# Patient Record
Sex: Female | Born: 2000 | Race: Black or African American | Hispanic: No | Marital: Single | Smoking: Never smoker
Health system: Southern US, Community
[De-identification: ages and names within clinical notes are randomized; demographics above are authoritative.]

---

## 2020-01-13 ENCOUNTER — Ambulatory Visit: Payer: Self-pay

## 2020-01-14 ENCOUNTER — Encounter (HOSPITAL_COMMUNITY): Payer: Self-pay

## 2020-01-14 ENCOUNTER — Ambulatory Visit (HOSPITAL_COMMUNITY)
Admission: EM | Admit: 2020-01-14 | Discharge: 2020-01-14 | Disposition: A | Discharge status: Home / Self Care | Payer: Medicaid Other | Attending: Emergency Medicine | Admitting: Emergency Medicine

## 2020-01-14 DIAGNOSIS — Z3202 Encounter for pregnancy test, result negative: Secondary | ICD-10-CM

## 2020-01-14 DIAGNOSIS — B3731 Acute candidiasis of vulva and vagina: Secondary | ICD-10-CM

## 2020-01-14 DIAGNOSIS — N760 Acute vaginitis: Secondary | ICD-10-CM

## 2020-01-14 DIAGNOSIS — N390 Urinary tract infection, site not specified: Secondary | ICD-10-CM

## 2020-01-14 DIAGNOSIS — B373 Candidiasis of vulva and vagina: Secondary | ICD-10-CM | POA: Diagnosis present

## 2020-01-14 LAB — POCT URINALYSIS DIPSTICK, ED / UC
Bilirubin Urine: NEGATIVE
Glucose, UA: NEGATIVE mg/dL
Ketones, ur: NEGATIVE mg/dL
Nitrite: NEGATIVE
Protein, ur: NEGATIVE mg/dL
Specific Gravity, Urine: 1.02 (ref 1.005–1.030)
Urobilinogen, UA: 0.2 mg/dL (ref 0.0–1.0)
pH: 6 (ref 5.0–8.0)

## 2020-01-14 LAB — POC URINE PREG, ED: Preg Test, Ur: NEGATIVE

## 2020-01-14 MED ORDER — MICONAZOLE NITRATE 2 % VA CREA: TOPICAL_CREAM | VAGINAL | 0 refills | Status: DC

## 2020-01-14 NOTE — Discharge Instructions (Signed)
This remains consistent with a yeast infection.  I have testing pending to confirm this and to ensure there is no other infectious process.  We will notify of you any positive findings or if any changes to treatment are needed. If normal or otherwise without concern to your results, we will not call you. Please log on to your MyChart to review your results if interested in so.   You may use the topical cream provided as well to help with external itching.  I would use the pills provided every other day. You should expect improvement.

## 2020-01-14 NOTE — ED Provider Notes (Signed)
[facility]    CSN: [id number] Arrival date & time: 01/14/20  1716      History   Chief Complaint Chief Complaint  Patient presents with  . Vaginitis  . Urinary Tract Infection    HPI Carrie Waters is a 19 y.o. female.   Carrie Waters presents with complaints of Vaginal itching, dryness and vulvar burning. Two days ago symptoms started. Went and saw another provider, was provided fluconazole for concern for yeast infection. Took one pill yesterday. Went this morning because she felt symptoms had worsened and had another exam, was provided additional fluconazole as told she had "severe yeast infection." Was also told she has a UTI. No urinary symptoms. She took additional fluconazole today, she feels that symptoms worsened.  Pain with walking. No vaginal odor. Describes discharge as cottage cheese appearing, but noted green color to this as well. Sexually active with 1 partner, doesn't use condoms. No previous stds. Has had yeast infections in the past but this feels worse. No history of diabetes.    ROS per HPI, negative if not otherwise mentioned.      History reviewed. No pertinent past medical history.  There are no problems to display for this patient.   History reviewed. No pertinent surgical history.  OB History   No obstetric history on file.      Home Medications    Prior to Admission medications   Medication Sig Start Date End Date Taking? Authorizing Provider  miconazole (MONISTAT 7) 2 % vaginal cream Topically to vulva twice a day as needed 01/14/20   Georgetta Haber, NP    Family History Family History  Family history unknown: Yes    Social History Social History   Tobacco Use  . Smoking status: Never Smoker  Substance Use Topics  . Alcohol use: Not on file  . Drug use: Not on file     Allergies   Sulfa antibiotics   Review of Systems Review of Systems   Physical Exam Triage Vital Signs ED Triage Vitals  Enc Vitals  Group     BP 01/14/20 2006 128/77     Pulse Rate 01/14/20 2006 75     Resp 01/14/20 2006 18     Temp 01/14/20 2006 98.7 F (37.1 C)     Temp Source 01/14/20 2006 Oral     SpO2 01/14/20 2006 97 %     Weight --      Height --      Head Circumference --      Peak Flow --      Pain Score 01/14/20 2008 9     Pain Loc --      Pain Edu? --      Excl. in GC? --    No data found.  Updated Vital Signs BP 128/77 (BP Location: Right Arm)   Pulse 75   Temp 98.7 F (37.1 C) (Oral)   Resp 18   SpO2 97%   Visual Acuity Right Eye Distance:   Left Eye Distance:   Bilateral Distance:    Right Eye Near:   Left Eye Near:    Bilateral Near:     Physical Exam Constitutional:      General: She is not in acute distress.    Appearance: She is well-developed.  Cardiovascular:     Rate and Rhythm: Normal rate.  Pulmonary:     Effort: Pulmonary effort is normal.  Genitourinary:    Comments: Significant thick white, slight green tinged,  cottage cheese discharge. No external rash or skin breakdown noted  Skin:    General: Skin is warm and dry.  Neurological:     Mental Status: She is alert and oriented to person, place, and time.      UC Treatments / Results  Labs (all labs ordered are listed, but only abnormal results are displayed) Labs Reviewed  POCT URINALYSIS DIPSTICK, ED / UC - Abnormal; Notable for the following components:      Result Value   Hgb urine dipstick TRACE (*)    Leukocytes,Ua LARGE (*)    All other components within normal limits  URINE CULTURE  POC URINE PREG, ED  CERVICOVAGINAL ANCILLARY ONLY    EKG   Radiology No results found.  Procedures Procedures (including critical care time)  Medications Ordered in UC Medications - No data to display  Initial Impression / Assessment and Plan / UC Course  I have reviewed the triage vital signs and the nursing notes.  Pertinent labs & imaging results that were available during my care of the patient were  reviewed by me and considered in my medical decision making (see chart for details).     Continue with prescribed fluconazole. External cream provided as well as needed. Supportive cares for itching, with vaginal cytology pending to ensure no other superimposed infection. Return precautions provided. Patient verbalized understanding and agreeable to plan.   Final Clinical Impressions(s) / UC Diagnoses   Final diagnoses:  Candidal vaginitis     Discharge Instructions     This remains consistent with a yeast infection.  I have testing pending to confirm this and to ensure there is no other infectious process.  We will notify of you any positive findings or if any changes to treatment are needed. If normal or otherwise without concern to your results, we will not call you. Please log on to your MyChart to review your results if interested in so.   You may use the topical cream provided as well to help with external itching.  I would use the pills provided every other day. You should expect improvement.     ED Prescriptions    Medication Sig Dispense Auth. Provider   miconazole (MONISTAT 7) 2 % vaginal cream Topically to vulva twice a day as needed 45 g Linus Mako B, NP     PDMP not reviewed this encounter.   Georgetta Haber, NP 01/14/20 2048

## 2020-01-14 NOTE — ED Triage Notes (Signed)
Pt presents with vaginal burning , itching, and dryness for past few days: pt states she was seen at another urgent care and diagnosed with UTI and yeast infection but they told her she had to choose between treatment because they would not give her a treatment for both.

## 2020-01-15 LAB — CERVICOVAGINAL ANCILLARY ONLY
Bacterial Vaginitis (gardnerella): NEGATIVE
Candida Glabrata: NEGATIVE
Candida Vaginitis: POSITIVE — AB
Chlamydia: NEGATIVE
Comment: NEGATIVE
Comment: NEGATIVE
Comment: NEGATIVE
Comment: NEGATIVE
Comment: NEGATIVE
Comment: NORMAL
Neisseria Gonorrhea: NEGATIVE
Trichomonas: NEGATIVE

## 2020-01-15 LAB — URINE CULTURE

## 2020-07-05 ENCOUNTER — Other Ambulatory Visit: Payer: Self-pay

## 2020-07-05 ENCOUNTER — Encounter (HOSPITAL_COMMUNITY): Payer: Self-pay

## 2020-07-05 ENCOUNTER — Ambulatory Visit (HOSPITAL_COMMUNITY)
Admission: RE | Admit: 2020-07-05 | Discharge: 2020-07-05 | Disposition: A | Discharge status: Home / Self Care | Payer: Medicaid Other | Source: Ambulatory Visit | Attending: Emergency Medicine | Admitting: Emergency Medicine

## 2020-07-05 VITALS — BP 110/94 | HR 75 | Temp 98.6°F | Resp 18

## 2020-07-05 DIAGNOSIS — R300 Dysuria: Secondary | ICD-10-CM | POA: Insufficient documentation

## 2020-07-05 LAB — POCT URINALYSIS DIPSTICK, ED / UC
Bilirubin Urine: NEGATIVE
Glucose, UA: NEGATIVE mg/dL
Ketones, ur: NEGATIVE mg/dL
Nitrite: NEGATIVE
Protein, ur: NEGATIVE mg/dL
Specific Gravity, Urine: 1.025 (ref 1.005–1.030)
Urobilinogen, UA: 0.2 mg/dL (ref 0.0–1.0)
pH: 8 (ref 5.0–8.0)

## 2020-07-05 NOTE — ED Triage Notes (Addendum)
Pt in with c/o dysuria, pt also requesting STD testing  States "it feels weird when I'm peeing and it feels like I have to pee a lot but I don't"  Denies any abdominal or back pain,

## 2020-07-05 NOTE — ED Provider Notes (Signed)
[facility]  ____________________________________________  Time seen: Approximately 3:14 PM  I have reviewed the triage vital signs and the nursing notes.   HISTORY  Chief Complaint dysuria and STD testing   Historian Patient    HPI Carrie Waters is a 20 y.o. female presents to the urgent care with mild dysuria.  Patient states that when she stops urinating she has some discomfort.  She states it started last night.  She denies increased urinary frequency or low back pain. No nausea or vomiting.  She states that she has not had recent unprotected sex.  Denies changes in vaginal discharge or dyspareunia.   History reviewed. No pertinent past medical history.   Immunizations up to date:  Yes.     History reviewed. No pertinent past medical history.  There are no problems to display for this patient.   History reviewed. No pertinent surgical history.  Prior to Admission medications   Medication Sig Start Date End Date Taking? Authorizing Provider  miconazole (MONISTAT 7) 2 % vaginal cream Topically to vulva twice a day as needed 01/14/20   Georgetta Haber, NP    Allergies Sulfa antibiotics  Family History  Family history unknown: Yes    Social History Social History   Tobacco Use  . Smoking status: Never Smoker  . Smokeless tobacco: Never Used     Review of Systems  Constitutional: No fever/chills Eyes:  No discharge ENT: No upper respiratory complaints. Respiratory: no cough. No SOB/ use of accessory muscles to breath Gastrointestinal:   No nausea, no vomiting.  No diarrhea.  No constipation. Musculoskeletal: Negative for musculoskeletal pain. Skin: Negative for rash, abrasions, lacerations, ecchymosis.    ____________________________________________   PHYSICAL EXAM:  VITAL SIGNS: ED Triage Vitals  Enc Vitals Group     BP 07/05/20 1418 (!) 110/94     Pulse Rate 07/05/20 1418 75     Resp 07/05/20 1418 18     Temp 07/05/20 1418  98.6 F (37 C)     Temp src --      SpO2 07/05/20 1418 99 %     Weight --      Height --      Head Circumference --      Peak Flow --      Pain Score 07/05/20 1417 0     Pain Loc --      Pain Edu? --      Excl. in GC? --      Constitutional: Alert and oriented. Well appearing and in no acute distress. Eyes: Conjunctivae are normal. PERRL. EOMI. Head: Atraumatic. ENT: Cardiovascular: Normal rate, regular rhythm. Normal S1 and S2.  Good peripheral circulation. Respiratory: Normal respiratory effort without tachypnea or retractions. Lungs CTAB. Good air entry to the bases with no decreased or absent breath sounds Gastrointestinal: Bowel sounds x 4 quadrants. Soft and nontender to palpation. No guarding or rigidity. No distention. Musculoskeletal: Full range of motion to all extremities. No obvious deformities noted Neurologic:  Normal for age. No gross focal neurologic deficits are appreciated.  Skin:  Skin is warm, dry and intact. No rash noted. Psychiatric: Mood and affect are normal for age. Speech and behavior are normal.   ____________________________________________   LABS (all labs ordered are listed, but only abnormal results are displayed)  Labs Reviewed  POCT URINALYSIS DIPSTICK, ED / UC - Abnormal; Notable for the following components:      Result Value   Hgb urine dipstick TRACE (*)  Leukocytes,Ua TRACE (*)    All other components within normal limits  URINE CULTURE  CERVICOVAGINAL ANCILLARY ONLY   ____________________________________________  EKG   ____________________________________________  RADIOLOGY  No results found.  ____________________________________________    PROCEDURES  Procedure(s) performed:     Procedures     Medications - No data to display   ____________________________________________   INITIAL IMPRESSION / ASSESSMENT AND PLAN / ED COURSE  Pertinent labs & imaging results that were available during my care of the  patient were reviewed by me and considered in my medical decision making (see chart for details).      Assessment and plan Dysuria 20 year old female presents to the urgent care with dysuria that started last night.  Vital signs are reassuring at triage.  Abdomen was soft and nontender without guarding.  Urinalysis does not show findings consistent with UTI.  Urine culture is pending.  Testing for gonorrhea, chlamydia, trichomoniasis, BV and yeast are also in process.  Given few symptoms at this time, will hold off on empiric treatment.  Patient was cautioned that should her urine culture indicate growth, patient will be prescribed an antibiotic. Recommended increased hydration at home.      ____________________________________________  FINAL CLINICAL IMPRESSION(S) / ED DIAGNOSES  Final diagnoses:  Dysuria      NEW MEDICATIONS STARTED DURING THIS VISIT:  ED Discharge Orders    None          This chart was dictated using voice recognition software/Dragon. Despite best efforts to proofread, errors can occur which can change the meaning. Any change was purely unintentional.     Orvil Feil, PA-C 07/05/20 1518

## 2020-07-05 NOTE — Discharge Instructions (Signed)
Continue to stay hydrated at home.

## 2020-07-06 ENCOUNTER — Telehealth (HOSPITAL_COMMUNITY): Payer: Self-pay | Admitting: Emergency Medicine

## 2020-07-06 LAB — CERVICOVAGINAL ANCILLARY ONLY
Bacterial Vaginitis (gardnerella): POSITIVE — AB
Candida Glabrata: NEGATIVE
Candida Vaginitis: NEGATIVE
Chlamydia: NEGATIVE
Comment: NEGATIVE
Comment: NEGATIVE
Comment: NEGATIVE
Comment: NEGATIVE
Comment: NEGATIVE
Comment: NORMAL
Neisseria Gonorrhea: NEGATIVE
Trichomonas: NEGATIVE

## 2020-07-06 MED ORDER — METRONIDAZOLE 500 MG PO TABS: 500.0000 mg | ORAL_TABLET | Freq: Two times a day (BID) | ORAL | 0 refills | Status: DC

## 2020-07-06 MED ORDER — FLUCONAZOLE 150 MG PO TABS: 150.0000 mg | ORAL_TABLET | Freq: Once | ORAL | 0 refills | Status: AC

## 2020-07-07 ENCOUNTER — Telehealth (HOSPITAL_COMMUNITY): Payer: Self-pay | Admitting: Emergency Medicine

## 2020-07-07 LAB — URINE CULTURE: Culture: >=100000 — AB

## 2020-07-07 MED ORDER — NITROFURANTOIN MONOHYD MACRO 100 MG PO CAPS: 100.0000 mg | ORAL_CAPSULE | Freq: Two times a day (BID) | ORAL | 0 refills | Status: DC

## 2020-08-20 DIAGNOSIS — Z862 Personal history of diseases of the blood and blood-forming organs and certain disorders involving the immune mechanism: Secondary | ICD-10-CM | POA: Insufficient documentation

## 2020-11-27 ENCOUNTER — Other Ambulatory Visit: Payer: Self-pay

## 2020-11-27 ENCOUNTER — Encounter (HOSPITAL_COMMUNITY): Payer: Self-pay | Admitting: Emergency Medicine

## 2020-11-27 ENCOUNTER — Ambulatory Visit (HOSPITAL_COMMUNITY)
Admission: EM | Admit: 2020-11-27 | Discharge: 2020-11-27 | Disposition: A | Discharge status: Home / Self Care | Payer: Medicaid Other | Attending: Student | Admitting: Student

## 2020-11-27 DIAGNOSIS — B373 Candidiasis of vulva and vagina: Secondary | ICD-10-CM | POA: Diagnosis not present

## 2020-11-27 DIAGNOSIS — B3731 Acute candidiasis of vulva and vagina: Secondary | ICD-10-CM

## 2020-11-27 DIAGNOSIS — M5440 Lumbago with sciatica, unspecified side: Secondary | ICD-10-CM

## 2020-11-27 DIAGNOSIS — Z113 Encounter for screening for infections with a predominantly sexual mode of transmission: Secondary | ICD-10-CM | POA: Diagnosis not present

## 2020-11-27 MED ORDER — TIZANIDINE HCL 2 MG PO TABS: 2.0000 mg | ORAL_TABLET | Freq: Three times a day (TID) | ORAL | 0 refills | Status: DC | PRN

## 2020-11-27 MED ORDER — FLUCONAZOLE 150 MG PO TABS: 150.0000 mg | ORAL_TABLET | Freq: Every day | ORAL | 0 refills | Status: DC

## 2020-11-27 NOTE — ED Provider Notes (Signed)
[facility]    CSN: [id number] Arrival date & time: 11/27/20  1609      History   Chief Complaint Chief Complaint  Patient presents with   Vaginal Discharge    HPI Carrie Waters is a 20 y.o. female presenting with vaginal discharge, STI screen, lumbar strain with sciatica.  Medical history BV, yeast. -Notes 3 days of thick white vaginal discharge with some external itching, consistent with previous yeast infections.  She does have 1 new female partner and endorses unprotected sex.  Denies new products, change in routine, recent abx. Denies hematuria, dysuria, frequency, urgency, flank pain, n/v/d/abd pain, fevers/chills, abdnormal vaginal rashes/lesions.  -OCP contraception, states not pregnant or breastfeeding. -Also with few months of left-sided lumbar paraspinous muscle tenderness with left-sided sciatica, following standing all day at work.  Has not tried any medications for the symptoms. Denies numbness in arms/legs, denies weakness in arms/legs, denies saddle anesthesia, denies bowel/bladder incontinence, denies urinary retention, denies constipation.   HPI  History reviewed. No pertinent past medical history.  There are no problems to display for this patient.   History reviewed. No pertinent surgical history.  OB History   No obstetric history on file.      Home Medications    Prior to Admission medications   Medication Sig Start Date End Date Taking? Authorizing Provider  fluconazole (DIFLUCAN) 150 MG tablet Take 1 tablet (150 mg total) by mouth daily. -For your yeast infection, start the Diflucan (fluconazole)- Take one pill today (day 1). If you're still having symptoms in 3 days, take the second pill. 11/27/20  Yes Rhys Martini, PA-C  tiZANidine (ZANAFLEX) 2 MG tablet Take 1 tablet (2 mg total) by mouth every 8 (eight) hours as needed for muscle spasms. 11/27/20  Yes Rhys Martini, PA-C    Family History Family History  Problem Relation Age of  Onset   Healthy Mother    Healthy Father     Social History Social History   Tobacco Use   Smoking status: Never   Smokeless tobacco: Never  Vaping Use   Vaping Use: Never used  Substance Use Topics   Alcohol use: Yes   Drug use: Never     Allergies   Sulfa antibiotics   Review of Systems Review of Systems  Constitutional:  Negative for chills, fever and unexpected weight change.  HENT:  Negative for sore throat.   Eyes:  Negative for pain and redness.  Respiratory:  Negative for chest tightness and shortness of breath.   Cardiovascular:  Negative for chest pain and palpitations.  Gastrointestinal:  Negative for abdominal pain, diarrhea, nausea and vomiting.  Genitourinary:  Positive for vaginal discharge. Negative for decreased urine volume, difficulty urinating, dysuria, flank pain, frequency, genital sores, hematuria, menstrual problem, pelvic pain, urgency, vaginal bleeding and vaginal pain.  Musculoskeletal:  Positive for back pain. Negative for arthralgias, gait problem, joint swelling, myalgias, neck pain and neck stiffness.  Skin:  Negative for rash and wound.  Neurological:  Negative for dizziness, tremors, seizures, syncope, facial asymmetry, speech difficulty, weakness, light-headedness, numbness and headaches.  All other systems reviewed and are negative.   Physical Exam Triage Vital Signs ED Triage Vitals  Enc Vitals Group     BP 11/27/20 1620 111/72     Pulse Rate 11/27/20 1620 71     Resp 11/27/20 1620 18     Temp 11/27/20 1620 99 F (37.2 C)     Temp Source 11/27/20 1620 Oral  SpO2 11/27/20 1620 100 %     Weight --      Height --      Head Circumference --      Peak Flow --      Pain Score 11/27/20 1617 0     Pain Loc --      Pain Edu? --      Excl. in GC? --    No data found.  Updated Vital Signs BP 111/72 (BP Location: Right Arm)   Pulse 71   Temp 99 F (37.2 C) (Oral)   Resp 18   LMP 11/13/2020   SpO2 100%   Visual  Acuity Right Eye Distance:   Left Eye Distance:   Bilateral Distance:    Right Eye Near:   Left Eye Near:    Bilateral Near:     Physical Exam Vitals reviewed.  Constitutional:      General: She is not in acute distress.    Appearance: Normal appearance. She is not ill-appearing.  HENT:     Head: Normocephalic and atraumatic.     Mouth/Throat:     Mouth: Mucous membranes are moist.     Comments: Moist mucous membranes Eyes:     Extraocular Movements: Extraocular movements intact.     Pupils: Pupils are equal, round, and reactive to light.  Cardiovascular:     Rate and Rhythm: Normal rate and regular rhythm.     Heart sounds: Normal heart sounds.  Pulmonary:     Effort: Pulmonary effort is normal.     Breath sounds: Normal breath sounds and air entry. No wheezing, rhonchi or rales.  Abdominal:     General: Bowel sounds are normal. There is no distension.     Palpations: Abdomen is soft. There is no mass.     Tenderness: There is no abdominal tenderness. There is no right CVA tenderness, left CVA tenderness, guarding or rebound.  Genitourinary:    Comments: deferred Musculoskeletal:     Cervical back: Normal range of motion. No swelling, deformity, signs of trauma, rigidity, spasms, tenderness, bony tenderness or crepitus. No pain with movement.     Thoracic back: No swelling, deformity, signs of trauma, spasms, tenderness or bony tenderness. Normal range of motion. No scoliosis.     Lumbar back: No swelling, deformity, signs of trauma, spasms, tenderness or bony tenderness. Normal range of motion. Negative right straight leg raise test and negative left straight leg raise test. No scoliosis.     Comments: Left-sided lumbar paraspinous muscle tenderness elicited with extension lumbar spine. No tenderness to palpation. Strength and sensation intact upper and lower extremities, no saddle anaesthesia. Negative straight leg raise bilaterally. No midline spinous tenderness, deformity,  stepoff.  Absolutely no other injury, deformity, tenderness, ecchymosis, abrasion.  Skin:    General: Skin is warm.     Capillary Refill: Capillary refill takes less than 2 seconds.     Comments: Good skin turgor  Neurological:     General: No focal deficit present.     Mental Status: She is alert and oriented to person, place, and time.     Cranial Nerves: No cranial nerve deficit.  Psychiatric:        Mood and Affect: Mood normal.        Behavior: Behavior normal.        Thought Content: Thought content normal.        Judgment: Judgment normal.     UC Treatments / Results  Labs (all labs ordered are  listed, but only abnormal results are displayed) Labs Reviewed  CERVICOVAGINAL ANCILLARY ONLY    EKG   Radiology No results found.  Procedures Procedures (including critical care time)  Medications Ordered in UC Medications - No data to display  Initial Impression / Assessment and Plan / UC Course  I have reviewed the triage vital signs and the nursing notes.  Pertinent labs & imaging results that were available during my care of the patient were reviewed by me and considered in my medical decision making (see chart for details).     This patient is a very pleasant 20 y.o. year old presenting with multiple complaints: vaginal discharge/suspected candidiasis; STI screen; lumbar strain with sciatica. OCP for contraception, states LMP was 2 weeks ago and she is not breastfeeding. Afebrile, nontachycardic, no reproducible abd pain or CVAT. History vaginal candidiasis and BV.   Will send self-swab for G/C, trich, yeast, BV testing. Declines HIV, RPR. Will treat empirically with diflucan as below. Safe sex precautions.   For lumbar strain with sciatica- no red flag symptoms. Trial of Zanaflex, heat, ROM exercises. F/u with ortho.  ED return precautions discussed. Patient verbalizes understanding and agreement.   Coding Level 4 for review of past notes/labs, order and  interpretation of labs today, and prescription drug management  Final Clinical Impressions(s) / UC Diagnoses   Final diagnoses:  Back pain of lumbar region with sciatica  Vaginal candida  Routine screening for STI (sexually transmitted infection)     Discharge Instructions      For the yeast infection: -For your yeast infection, start the Diflucan (fluconazole)- Take one pill today (day 1). If you're still having symptoms in 3 days, take the second pill.  -We have sent testing for sexually transmitted infections. We will notify you of any positive results once they are received. If required, we will prescribe any medications you might need. Please refrain from all sexual activity until treatment is complete.  -Seek additional medical attention if you develop fevers/chills, new/worsening abdominal pain, new/worsening vaginal discomfort/discharge, etc.  For the back pain:  -Start the muscle relaxer-Zanaflex (tizanidine), up to 3 times daily for muscle spasms and pain.  This can make you drowsy, so take at bedtime or when you do not need to drive or operate machinery. -You can also take Tylenol up to 1000 mg 3 times daily, and ibuprofen up to 800 mg 3 times daily with food.  You can take these together, or alternate every 3-4 hours. -Heating pad, gentle range of motion exercises (information later in paperwork) -If symptoms persist in 7 days, follow-up with an orthopedist. I recommend [facility] at [address] [place], [place] [address]. You can schedule an appointment by calling [phone]) or online [email or web address]), but they also have a walk-in clinic M-F 8a-8p and Sat 10a-3p.      ED Prescriptions     Medication Sig Dispense Auth. Provider   fluconazole (DIFLUCAN) 150 MG tablet Take 1 tablet (150 mg total) by mouth daily. -For your yeast infection, start the Diflucan (fluconazole)- Take one pill today (day 1). If you're still having symptoms in 3 days, take the second  pill. 2 tablet Rhys Martini, PA-C   tiZANidine (ZANAFLEX) 2 MG tablet Take 1 tablet (2 mg total) by mouth every 8 (eight) hours as needed for muscle spasms. 21 tablet Rhys Martini, PA-C      PDMP not reviewed this encounter.   Rhys Martini, PA-C 11/27/20 1650

## 2020-11-27 NOTE — Discharge Instructions (Addendum)
For the yeast infection: -For your yeast infection, start the Diflucan (fluconazole)- Take one pill today (day 1). If you're still having symptoms in 3 days, take the second pill.  -We have sent testing for sexually transmitted infections. We will notify you of any positive results once they are received. If required, we will prescribe any medications you might need. Please refrain from all sexual activity until treatment is complete.  -Seek additional medical attention if you develop fevers/chills, new/worsening abdominal pain, new/worsening vaginal discomfort/discharge, etc.  For the back pain:  -Start the muscle relaxer-Zanaflex (tizanidine), up to 3 times daily for muscle spasms and pain.  This can make you drowsy, so take at bedtime or when you do not need to drive or operate machinery. -You can also take Tylenol up to 1000 mg 3 times daily, and ibuprofen up to 800 mg 3 times daily with food.  You can take these together, or alternate every 3-4 hours. -Heating pad, gentle range of motion exercises (information later in paperwork) -If symptoms persist in 7 days, follow-up with an orthopedist. I recommend [facility] at [address] [place], [place] [address]. You can schedule an appointment by calling [phone]) or online [email or web address]), but they also have a walk-in clinic M-F 8a-8p and Sat 10a-3p.

## 2020-11-27 NOTE — ED Triage Notes (Signed)
Reports white vaginal discharge that started 2-3 days ago.  No odor, no itching.  No abdominal pain or back pain

## 2020-11-29 LAB — CERVICOVAGINAL ANCILLARY ONLY
Bacterial Vaginitis (gardnerella): POSITIVE — AB
Candida Glabrata: NEGATIVE
Candida Vaginitis: NEGATIVE
Chlamydia: NEGATIVE
Comment: NEGATIVE
Comment: NEGATIVE
Comment: NEGATIVE
Comment: NEGATIVE
Comment: NEGATIVE
Comment: NORMAL
Neisseria Gonorrhea: NEGATIVE
Trichomonas: NEGATIVE

## 2020-12-02 ENCOUNTER — Telehealth (HOSPITAL_COMMUNITY): Payer: Self-pay | Admitting: Emergency Medicine

## 2020-12-02 MED ORDER — METRONIDAZOLE 500 MG PO TABS: 500.0000 mg | ORAL_TABLET | Freq: Two times a day (BID) | ORAL | 0 refills | Status: DC

## 2021-01-19 ENCOUNTER — Encounter (HOSPITAL_COMMUNITY): Payer: Self-pay

## 2021-01-19 ENCOUNTER — Ambulatory Visit (HOSPITAL_COMMUNITY)
Admission: EM | Admit: 2021-01-19 | Discharge: 2021-01-19 | Disposition: A | Discharge status: Home / Self Care | Payer: Medicaid Other | Attending: Internal Medicine | Admitting: Internal Medicine

## 2021-01-19 DIAGNOSIS — N898 Other specified noninflammatory disorders of vagina: Secondary | ICD-10-CM | POA: Diagnosis present

## 2021-01-19 DIAGNOSIS — Z113 Encounter for screening for infections with a predominantly sexual mode of transmission: Secondary | ICD-10-CM

## 2021-01-19 DIAGNOSIS — Z114 Encounter for screening for human immunodeficiency virus [HIV]: Secondary | ICD-10-CM | POA: Diagnosis present

## 2021-01-19 LAB — HIV ANTIBODY (ROUTINE TESTING W REFLEX): HIV Screen 4th Generation wRfx: NONREACTIVE

## 2021-01-19 NOTE — Discharge Instructions (Addendum)
As we discussed, we will test you for bacterial vaginosis, yeast, trichomonas, gonorrhea, chlamydia.  These results will likely come back tomorrow and we will call you and treat you if there is something that needs to be treated.  We will also test you for HIV and syphilis through your blood.  You will be contacted with these results as well.  I recommend following up with your primary care provider for a Pap smear.  If you have significant worsening of symptoms including pain or developing fever, or inability to tolerate anything by mouth, you should be seen by medical provider right away.

## 2021-01-19 NOTE — ED Triage Notes (Signed)
Pt presents with vaginal odor X 4 days.

## 2021-01-19 NOTE — ED Provider Notes (Signed)
[facility]    CSN: [id number] Arrival date & time: 01/19/21  1014      History   Chief Complaint Chief Complaint  Patient presents with   Vaginal Odor    HPI Carrie Waters is a 20 y.o. female.   Vaginal Discharge/Odor Discharge started a few days ago Discharge appears to have some spotting in it, but has stopped now She endorses vaginal odors She denies vaginal pruritis, dysuria, hematuria, pelvic pain, nausea, vomiting, fevers No history of STIs She is sexually active and occasionally uses condoms.   Contraception: Nexplanon.   No LMP recorded. Patient has had an implant.  Last Pap: never Desires HIV/RPR: yes      History reviewed. No pertinent past medical history.  There are no problems to display for this patient.   History reviewed. No pertinent surgical history.  OB History   No obstetric history on file.      Home Medications    Prior to Admission medications   Medication Sig Start Date End Date Taking? Authorizing Provider  fluconazole (DIFLUCAN) 150 MG tablet Take 1 tablet (150 mg total) by mouth daily. -For your yeast infection, start the Diflucan (fluconazole)- Take one pill today (day 1). If you're still having symptoms in 3 days, take the second pill. 11/27/20   Rhys Martini, PA-C  metroNIDAZOLE (FLAGYL) 500 MG tablet Take 1 tablet (500 mg total) by mouth 2 (two) times daily. 12/02/20   Lamptey, Britta Mccreedy, MD  tiZANidine (ZANAFLEX) 2 MG tablet Take 1 tablet (2 mg total) by mouth every 8 (eight) hours as needed for muscle spasms. 11/27/20   Rhys Martini, PA-C    Family History Family History  Problem Relation Age of Onset   Healthy Mother    Healthy Father     Social History Social History   Tobacco Use   Smoking status: Never   Smokeless tobacco: Never  Vaping Use   Vaping Use: Never used  Substance Use Topics   Alcohol use: Yes   Drug use: Never     Allergies   Sulfa antibiotics   Review of Systems Review  of Systems  All other systems reviewed and are negative. Per HPI  Physical Exam Triage Vital Signs ED Triage Vitals  Enc Vitals Group     BP      Pulse      Resp      Temp      Temp src      SpO2      Weight      Height      Head Circumference      Peak Flow      Pain Score      Pain Loc      Pain Edu?      Excl. in GC?    No data found.  Updated Vital Signs BP 110/67 (BP Location: Left Arm)   Pulse 69   Temp 99.3 F (37.4 C) (Oral)   Resp 18   SpO2 96%   Visual Acuity Right Eye Distance:   Left Eye Distance:   Bilateral Distance:    Right Eye Near:   Left Eye Near:    Bilateral Near:     Physical Exam Constitutional:      General: She is not in acute distress.    Appearance: Normal appearance. She is not ill-appearing or toxic-appearing.  Pulmonary:     Effort: Pulmonary effort is normal. No respiratory distress.  Abdominal:  Palpations: Abdomen is soft.     Tenderness: There is no abdominal tenderness. There is no right CVA tenderness or left CVA tenderness.  Skin:    General: Skin is warm and dry.  Neurological:     Mental Status: She is alert and oriented to person, place, and time.     UC Treatments / Results  Labs (all labs ordered are listed, but only abnormal results are displayed) Labs Reviewed  HIV ANTIBODY (ROUTINE TESTING W REFLEX)  RPR  CERVICOVAGINAL ANCILLARY ONLY    EKG   Radiology No results found.  Procedures Procedures (including critical care time)  Medications Ordered in UC Medications - No data to display  Initial Impression / Assessment and Plan / UC Course  I have reviewed the triage vital signs and the nursing notes.  Pertinent labs & imaging results that were available during my care of the patient were reviewed by me and considered in my medical decision making (see chart for details).     Patient is a 20 year old female who presents with vaginal odor and concerns for bacterial vaginosis per her  report.  She would also like STD testing today, desires HIV and RPR as well.  She has a Nexplanon that is up-to-date per her report.  Has never had a Pap smear.  Did recommend follow-up with her primary care provider for a Pap smear.  Vaginal swab and HIV/RPR performed today.  Will wait to treat based off of these results.  She was discharged home in stable condition.   Final Clinical Impressions(s) / UC Diagnoses   Final diagnoses:  Vaginal odor  Screen for sexually transmitted diseases  Screening for HIV (human immunodeficiency virus)     Discharge Instructions      As we discussed, we will test you for bacterial vaginosis, yeast, trichomonas, gonorrhea, chlamydia.  These results will likely come back tomorrow and we will call you and treat you if there is something that needs to be treated.  We will also test you for HIV and syphilis through your blood.  You will be contacted with these results as well.  I recommend following up with your primary care provider for a Pap smear.  If you have significant worsening of symptoms including pain or developing fever, or inability to tolerate anything by mouth, you should be seen by medical provider right away.     ED Prescriptions   None    PDMP not reviewed this encounter.   Butler Vegh, Solmon Ice, DO 01/19/21 1142

## 2021-01-20 ENCOUNTER — Telehealth (HOSPITAL_COMMUNITY): Payer: Self-pay | Admitting: Emergency Medicine

## 2021-01-20 LAB — CERVICOVAGINAL ANCILLARY ONLY
Bacterial Vaginitis (gardnerella): POSITIVE — AB
Candida Glabrata: NEGATIVE
Candida Vaginitis: NEGATIVE
Chlamydia: NEGATIVE
Comment: NEGATIVE
Comment: NEGATIVE
Comment: NEGATIVE
Comment: NEGATIVE
Comment: NEGATIVE
Comment: NORMAL
Neisseria Gonorrhea: NEGATIVE
Trichomonas: NEGATIVE

## 2021-01-20 LAB — RPR: RPR Ser Ql: NONREACTIVE

## 2021-01-20 MED ORDER — METRONIDAZOLE 500 MG PO TABS: 500.0000 mg | ORAL_TABLET | Freq: Two times a day (BID) | ORAL | 0 refills | Status: DC

## 2021-01-21 DIAGNOSIS — F4323 Adjustment disorder with mixed anxiety and depressed mood: Secondary | ICD-10-CM | POA: Insufficient documentation

## 2021-01-24 ENCOUNTER — Ambulatory Visit (HOSPITAL_COMMUNITY)
Admission: EM | Admit: 2021-01-24 | Discharge: 2021-01-24 | Disposition: A | Discharge status: Home / Self Care | Payer: Medicaid Other | Attending: Physician Assistant | Admitting: Physician Assistant

## 2021-01-24 ENCOUNTER — Other Ambulatory Visit: Payer: Self-pay

## 2021-01-24 ENCOUNTER — Encounter (HOSPITAL_COMMUNITY): Payer: Self-pay | Admitting: Emergency Medicine

## 2021-01-24 DIAGNOSIS — R0982 Postnasal drip: Secondary | ICD-10-CM | POA: Insufficient documentation

## 2021-01-24 DIAGNOSIS — Z20822 Contact with and (suspected) exposure to covid-19: Secondary | ICD-10-CM | POA: Diagnosis not present

## 2021-01-24 DIAGNOSIS — J029 Acute pharyngitis, unspecified: Secondary | ICD-10-CM | POA: Diagnosis not present

## 2021-01-24 DIAGNOSIS — R519 Headache, unspecified: Secondary | ICD-10-CM | POA: Diagnosis not present

## 2021-01-24 DIAGNOSIS — B349 Viral infection, unspecified: Secondary | ICD-10-CM

## 2021-01-24 DIAGNOSIS — R509 Fever, unspecified: Secondary | ICD-10-CM | POA: Diagnosis present

## 2021-01-24 DIAGNOSIS — Z882 Allergy status to sulfonamides status: Secondary | ICD-10-CM | POA: Diagnosis not present

## 2021-01-24 LAB — POCT RAPID STREP A, ED / UC: Streptococcus, Group A Screen (Direct): NEGATIVE

## 2021-01-24 NOTE — ED Triage Notes (Signed)
Pt c/o right neck lymph nodes that have been swollen for several days. Pt also having fevers, headaches, n/v and pain in right side of neck now spreading to posterior neck.

## 2021-01-24 NOTE — ED Provider Notes (Addendum)
[facility]    CSN: [id number] Arrival date & time: 01/24/21  1425      History   Chief Complaint Chief Complaint  Patient presents with   Fever   Headache    HPI Carrie Waters is a 20 y.o. female.   Pt complains of fatigue, fever, and headache that started last night.  Reports some sore throat and postnasal drip. Reports enlarged lymph nodes to the left neck that she noticed two days ago.  Reports fever at home of 100.  She denies congestion, cough, body aches, shortness of breath.  She has taken nothing for the sx.  She has had her COVID vaccine and booster.  Pt is in nursing school, recent completed a clinical in pediatrics.    History reviewed. No pertinent past medical history.  There are no problems to display for this patient.   History reviewed. No pertinent surgical history.  OB History   No obstetric history on file.      Home Medications    Prior to Admission medications   Medication Sig Start Date End Date Taking? Authorizing Provider  cefdinir (OMNICEF) 300 MG capsule Take 1 capsule (300 mg total) by mouth 2 (two) times daily. 01/26/21   Raspet, Noberto Retort, PA-C  tiZANidine (ZANAFLEX) 2 MG tablet Take 1 tablet (2 mg total) by mouth every 8 (eight) hours as needed for muscle spasms. 11/27/20   Rhys Martini, PA-C    Family History Family History  Problem Relation Age of Onset   Healthy Mother    Healthy Father     Social History Social History   Tobacco Use   Smoking status: Never   Smokeless tobacco: Never  Vaping Use   Vaping Use: Never used  Substance Use Topics   Alcohol use: Yes   Drug use: Never     Allergies   Sulfa antibiotics   Review of Systems Review of Systems  Constitutional:  Positive for fatigue and fever. Negative for chills.  HENT:  Positive for sore throat. Negative for congestion, ear pain and rhinorrhea.   Eyes:  Negative for pain and visual disturbance.  Respiratory:  Negative for cough and shortness  of breath.   Cardiovascular:  Negative for chest pain and palpitations.  Gastrointestinal:  Negative for abdominal pain and vomiting.  Genitourinary:  Negative for dysuria and hematuria.  Musculoskeletal:  Negative for arthralgias and back pain.  Skin:  Negative for color change and rash.  Neurological:  Negative for seizures and syncope.  All other systems reviewed and are negative.   Physical Exam Triage Vital Signs ED Triage Vitals  Enc Vitals Group     BP 01/24/21 1618 115/72     Pulse Rate 01/24/21 1618 (!) 113     Resp 01/24/21 1618 17     Temp 01/24/21 1618 99.5 F (37.5 C)     Temp Source 01/24/21 1618 Oral     SpO2 01/24/21 1618 98 %     Weight --      Height --      Head Circumference --      Peak Flow --      Pain Score 01/24/21 1617 6     Pain Loc --      Pain Edu? --      Excl. in GC? --    No data found.  Updated Vital Signs BP 115/72 (BP Location: Left Arm)   Pulse (!) 113   Temp 99.5 F (37.5 C) (Oral)  Resp 17   SpO2 98%   Visual Acuity Right Eye Distance:   Left Eye Distance:   Bilateral Distance:    Right Eye Near:   Left Eye Near:    Bilateral Near:     Physical Exam Vitals and nursing note reviewed.  Constitutional:      General: She is not in acute distress.    Appearance: She is well-developed.  HENT:     Head: Normocephalic and atraumatic.     Mouth/Throat:     Pharynx: Pharyngeal swelling and posterior oropharyngeal erythema present.     Tonsils: No tonsillar exudate.  Eyes:     Conjunctiva/sclera: Conjunctivae normal.  Cardiovascular:     Rate and Rhythm: Normal rate and regular rhythm.     Heart sounds: No murmur heard. Pulmonary:     Effort: Pulmonary effort is normal. No respiratory distress.     Breath sounds: Normal breath sounds.  Abdominal:     Palpations: Abdomen is soft.     Tenderness: There is no abdominal tenderness.  Musculoskeletal:     Cervical back: Neck supple.  Skin:    General: Skin is warm and dry.   Neurological:     Mental Status: She is alert.     UC Treatments / Results  Labs (all labs ordered are listed, but only abnormal results are displayed) Labs Reviewed  SARS CORONAVIRUS 2 (TAT 6-24 HRS)  CULTURE, GROUP A STREP [facility])  POCT RAPID STREP A, ED / UC    EKG   Radiology No results found.  Procedures Procedures (including critical care time)  Medications Ordered in UC Medications - No data to display  Initial Impression / Assessment and Plan / UC Course  I have reviewed the triage vital signs and the nursing notes.  Pertinent labs & imaging results that were available during my care of the patient were reviewed by me and considered in my medical decision making (see chart for details).     Viral illness, COVID test pending.  Pt vitals wnl, overall well appearing, in no acute distress.  Advised supportive treatment.  COVID test pending.  Work note given for today. Return precautions discussed.  Final Clinical Impressions(s) / UC Diagnoses   Final diagnoses:  Viral illness     Discharge Instructions      Recommend Tylenol as needed for headache and fever Drink plenty of fluids and rest Can use Mucinex and Flonase for post nasal drip  COVID test pending      ED Prescriptions   None    PDMP not reviewed this encounter.   Ward, Tylene Fantasia, PA-C 02/02/21 1633    Ward, Tylene Fantasia, PA-C 02/02/21 1646

## 2021-01-24 NOTE — Discharge Instructions (Addendum)
Recommend Tylenol as needed for headache and fever Drink plenty of fluids and rest Can use Mucinex and Flonase for post nasal drip  COVID test pending

## 2021-01-25 LAB — SARS CORONAVIRUS 2 (TAT 6-24 HRS): SARS Coronavirus 2: NEGATIVE

## 2021-01-26 ENCOUNTER — Other Ambulatory Visit: Payer: Self-pay

## 2021-01-26 ENCOUNTER — Encounter (HOSPITAL_COMMUNITY): Payer: Self-pay

## 2021-01-26 ENCOUNTER — Ambulatory Visit (HOSPITAL_COMMUNITY)
Admission: EM | Admit: 2021-01-26 | Discharge: 2021-01-26 | Disposition: A | Discharge status: Home / Self Care | Payer: Medicaid Other | Attending: Physician Assistant | Admitting: Physician Assistant

## 2021-01-26 DIAGNOSIS — M542 Cervicalgia: Secondary | ICD-10-CM | POA: Diagnosis not present

## 2021-01-26 DIAGNOSIS — J029 Acute pharyngitis, unspecified: Secondary | ICD-10-CM | POA: Insufficient documentation

## 2021-01-26 DIAGNOSIS — R591 Generalized enlarged lymph nodes: Secondary | ICD-10-CM | POA: Diagnosis not present

## 2021-01-26 LAB — CBC WITH DIFFERENTIAL/PLATELET
Abs Immature Granulocytes: 0.02 10*3/uL (ref 0.00–0.07)
Basophils Absolute: 0 10*3/uL (ref 0.0–0.1)
Basophils Relative: 0 %
Eosinophils Absolute: 0.1 10*3/uL (ref 0.0–0.5)
Eosinophils Relative: 1 %
HCT: 42.4 % (ref 36.0–46.0)
Hemoglobin: 14.2 g/dL (ref 12.0–15.0)
Immature Granulocytes: 0 %
Lymphocytes Relative: 17 %
Lymphs Abs: 1.6 10*3/uL (ref 0.7–4.0)
MCH: 28 pg (ref 26.0–34.0)
MCHC: 33.5 g/dL (ref 30.0–36.0)
MCV: 83.6 fL (ref 80.0–100.0)
Monocytes Absolute: 1.3 10*3/uL — ABNORMAL HIGH (ref 0.1–1.0)
Monocytes Relative: 13 %
Neutro Abs: 6.5 10*3/uL (ref 1.7–7.7)
Neutrophils Relative %: 69 %
Platelets: 186 10*3/uL (ref 150–400)
RBC: 5.07 MIL/uL (ref 3.87–5.11)
RDW: 13.1 % (ref 11.5–15.5)
WBC: 9.5 10*3/uL (ref 4.0–10.5)
nRBC: 0 % (ref 0.0–0.2)

## 2021-01-26 LAB — COMPREHENSIVE METABOLIC PANEL
ALT: 31 U/L (ref 0–44)
AST: 30 U/L (ref 15–41)
Albumin: 3.9 g/dL (ref 3.5–5.0)
Alkaline Phosphatase: 55 U/L (ref 38–126)
Anion gap: 8 (ref 5–15)
BUN: 7 mg/dL (ref 6–20)
CO2: 25 mmol/L (ref 22–32)
Calcium: 9.4 mg/dL (ref 8.9–10.3)
Chloride: 103 mmol/L (ref 98–111)
Creatinine, Ser: 0.73 mg/dL (ref 0.44–1.00)
GFR, Estimated: >60 mL/min (ref >60)
Glucose, Bld: 75 mg/dL (ref 70–99)
Potassium: 3.7 mmol/L (ref 3.5–5.1)
Sodium: 136 mmol/L (ref 135–145)
Total Bilirubin: 0.6 mg/dL (ref 0.3–1.2)
Total Protein: 7.6 g/dL (ref 6.5–8.1)

## 2021-01-26 LAB — POCT INFECTIOUS MONO SCREEN, ED / UC: Mono Screen: NEGATIVE

## 2021-01-26 LAB — POCT RAPID STREP A, ED / UC: Streptococcus, Group A Screen (Direct): NEGATIVE

## 2021-01-26 MED ORDER — CEFDINIR 300 MG PO CAPS: 300.0000 mg | ORAL_CAPSULE | Freq: Two times a day (BID) | ORAL | 0 refills | Status: DC

## 2021-01-26 NOTE — ED Provider Notes (Signed)
[facility]    CSN: [id number] Arrival date & time: 01/26/21  1036      History   Chief Complaint Chief Complaint  Patient presents with   Neck Pain   Sore Throat    HPI Carrie Waters is a 20 y.o. female.   Patient presents today with a 5-day history of sore throat pain.  She was seen by our clinic on 01/24/2021 which when strep was negative as well as COVID.  Throat culture is still pending.  She was encouraged to use over-the-counter medications for symptom relief.  Despite conservative treatment she continues to have sore throat with significant neck pain/swelling.  Pain is rated 6 on a 0-10 pain scale, localized to right submandibular area, worse with palpation or movement of neck, no alleviating factors identified.  She was treated for bacterial vaginosis but denies additional antibiotic use recently.  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  She is up-to-date on immunizations including COVID-19 vaccine.  Reports she has had decreased appetite but is able to eat and drink without difficulty; denies any dysphagia, muffled voice, fever.   History reviewed. No pertinent past medical history.  There are no problems to display for this patient.   History reviewed. No pertinent surgical history.  OB History   No obstetric history on file.      Home Medications    Prior to Admission medications   Medication Sig Start Date End Date Taking? Authorizing Provider  cefdinir (OMNICEF) 300 MG capsule Take 1 capsule (300 mg total) by mouth 2 (two) times daily. 01/26/21  Yes Shekina Cordell K, PA-C  tiZANidine (ZANAFLEX) 2 MG tablet Take 1 tablet (2 mg total) by mouth every 8 (eight) hours as needed for muscle spasms. 11/27/20   Rhys Martini, PA-C    Family History Family History  Problem Relation Age of Onset   Healthy Mother    Healthy Father     Social History Social History   Tobacco Use    Smoking status: Never   Smokeless tobacco: Never  Vaping Use   Vaping Use: Never used  Substance Use Topics   Alcohol use: Yes   Drug use: Never     Allergies   Sulfa antibiotics   Review of Systems Review of Systems  Constitutional:  Positive for activity change. Negative for appetite change, fatigue and fever.  HENT:  Positive for sore throat. Negative for congestion, sinus pressure, sneezing, trouble swallowing and voice change.   Respiratory:  Negative for cough and shortness of breath.   Cardiovascular:  Negative for chest pain.  Gastrointestinal:  Positive for nausea and vomiting. Negative for abdominal pain and diarrhea.  Neurological:  Negative for dizziness, light-headedness and headaches.    Physical Exam Triage Vital Signs ED Triage Vitals  Enc Vitals Group     BP 01/26/21 1215 113/73     Pulse Rate 01/26/21 1215 85     Resp 01/26/21 1215 17     Temp 01/26/21 1215 99.1 F (37.3 C)     Temp Source 01/26/21 1215 Oral     SpO2 01/26/21 1215 100 %     Weight --      Height --      Head Circumference --      Peak Flow --      Pain Score 01/26/21 1214 6     Pain Loc --      Pain Edu? --  Excl. in GC? --    No data found.  Updated Vital Signs BP 113/73 (BP Location: Left Arm)   Pulse 85   Temp 99.1 F (37.3 C) (Oral)   Resp 17   SpO2 100%   Visual Acuity Right Eye Distance:   Left Eye Distance:   Bilateral Distance:    Right Eye Near:   Left Eye Near:    Bilateral Near:     Physical Exam Vitals reviewed.  Constitutional:      General: She is awake. She is not in acute distress.    Appearance: Normal appearance. She is well-developed. She is not ill-appearing.     Comments: Very pleasant female appears stated age in no acute distress sitting comfortably in exam room  HENT:     Head: Normocephalic and atraumatic.     Right Ear: Tympanic membrane, ear canal and external ear normal. Tympanic membrane is not erythematous or bulging.     Left  Ear: Tympanic membrane, ear canal and external ear normal. Tympanic membrane is not erythematous or bulging.     Nose: Nose normal.     Right Sinus: No maxillary sinus tenderness or frontal sinus tenderness.     Left Sinus: No maxillary sinus tenderness or frontal sinus tenderness.     Mouth/Throat:     Pharynx: Uvula midline. No oropharyngeal exudate or posterior oropharyngeal erythema.     Tonsils: No tonsillar exudate or tonsillar abscesses. 2+ on the right. 2+ on the left.  Cardiovascular:     Rate and Rhythm: Normal rate and regular rhythm.     Heart sounds: Normal heart sounds, S1 normal and S2 normal. No murmur heard. Pulmonary:     Effort: Pulmonary effort is normal.     Breath sounds: Normal breath sounds. No wheezing, rhonchi or rales.     Comments: Clear to auscultation bilaterally Abdominal:     General: Bowel sounds are normal.     Palpations: Abdomen is soft.     Tenderness: There is no abdominal tenderness.  Lymphadenopathy:     Head:     Right side of head: Submandibular adenopathy present. No submental or tonsillar adenopathy.     Left side of head: No submental, submandibular or tonsillar adenopathy.     Cervical: No cervical adenopathy.  Psychiatric:        Behavior: Behavior is cooperative.     UC Treatments / Results  Labs (all labs ordered are listed, but only abnormal results are displayed) Labs Reviewed  CBC WITH DIFFERENTIAL/PLATELET  COMPREHENSIVE METABOLIC PANEL  POCT INFECTIOUS MONO SCREEN, ED / UC  POCT RAPID STREP A, ED / UC    EKG   Radiology No results found.  Procedures Procedures (including critical care time)  Medications Ordered in UC Medications - No data to display  Initial Impression / Assessment and Plan / UC Course  I have reviewed the triage vital signs and the nursing notes.  Pertinent labs & imaging results that were available during my care of the patient were reviewed by me and considered in my medical decision making  (see chart for details).      Strep testing was repeated today and was negative.  Monotest was also negative the patient has only been symptomatic for 5 days so this could be a false negative.  Given worsening symptoms including adenopathy will cover with Omnicef to treat infection.  CBC and CMP were obtained today-results pending.  Patient was encouraged use over-the-counter medications including Tylenol and ibuprofen.  Discussed alarm symptoms that warrant emergent evaluation.  Discussed that if symptoms or not improving within a week she should follow-up with an ENT and was given contact information for local provider that she would likely need imaging which cannot be arranged in urgent care.  Strict return precautions given to which she expressed understanding.  Final Clinical Impressions(s) / UC Diagnoses   Final diagnoses:  Pharyngitis, unspecified etiology  Lymphadenopathy  Neck pain     Discharge Instructions      We are going to treat for infection.  Please take Omnicef twice daily for 10 days.  We will contact you if your lab work is abnormal.  Alternate Tylenol and ibuprofen for fever and pain.  If you have any worsening symptoms please return for reevaluation.  I do recommend a follow-up with ear nose and throat if your symptoms do not completely resolve within a week for further evaluation.     ED Prescriptions     Medication Sig Dispense Auth. Provider   cefdinir (OMNICEF) 300 MG capsule Take 1 capsule (300 mg total) by mouth 2 (two) times daily. 20 capsule Kalif Kattner, Noberto Retort, PA-C      PDMP not reviewed this encounter.   Jeani Hawking, PA-C 01/26/21 1334

## 2021-01-26 NOTE — ED Triage Notes (Signed)
Pt presents with ongoing sore throat and neck pain for over a week; pt had negative strep and covid on Monday.

## 2021-01-26 NOTE — Discharge Instructions (Addendum)
We are going to treat for infection.  Please take Omnicef twice daily for 10 days.  We will contact you if your lab work is abnormal.  Alternate Tylenol and ibuprofen for fever and pain.  If you have any worsening symptoms please return for reevaluation.  I do recommend a follow-up with ear nose and throat if your symptoms do not completely resolve within a week for further evaluation.

## 2021-01-27 LAB — CULTURE, GROUP A STREP (THRC)

## 2021-02-23 ENCOUNTER — Ambulatory Visit (HOSPITAL_COMMUNITY): Payer: Medicaid Other

## 2021-02-25 ENCOUNTER — Ambulatory Visit (HOSPITAL_COMMUNITY): Payer: Medicaid Other

## 2021-03-01 ENCOUNTER — Other Ambulatory Visit: Payer: Self-pay

## 2021-03-01 ENCOUNTER — Ambulatory Visit (HOSPITAL_COMMUNITY)
Admission: EM | Admit: 2021-03-01 | Discharge: 2021-03-01 | Disposition: A | Discharge status: Home / Self Care | Payer: Medicaid Other | Attending: Internal Medicine | Admitting: Internal Medicine

## 2021-03-01 ENCOUNTER — Encounter (HOSPITAL_COMMUNITY): Payer: Self-pay

## 2021-03-01 DIAGNOSIS — J09X2 Influenza due to identified novel influenza A virus with other respiratory manifestations: Secondary | ICD-10-CM

## 2021-03-01 LAB — POC INFLUENZA A AND B ANTIGEN (URGENT CARE ONLY)
INFLUENZA A ANTIGEN, POC: POSITIVE — AB
INFLUENZA B ANTIGEN, POC: NEGATIVE

## 2021-03-01 LAB — POCT RAPID STREP A, ED / UC: Streptococcus, Group A Screen (Direct): NEGATIVE

## 2021-03-01 MED ORDER — ACETAMINOPHEN 325 MG PO TABS
ORAL_TABLET | ORAL | Status: AC
  Filled 2021-03-01: qty 2

## 2021-03-01 MED ORDER — ONDANSETRON 8 MG PO TBDP: 8.0000 mg | ORAL_TABLET | Freq: Three times a day (TID) | ORAL | 0 refills | Status: DC | PRN

## 2021-03-01 MED ORDER — ACETAMINOPHEN 325 MG PO TABS
650.0000 mg | ORAL_TABLET | Freq: Once | ORAL | Status: AC
  Administered 2021-03-01: 650 mg via ORAL

## 2021-03-01 MED ORDER — OSELTAMIVIR PHOSPHATE 75 MG PO CAPS: 75.0000 mg | ORAL_CAPSULE | Freq: Two times a day (BID) | ORAL | 0 refills | Status: DC

## 2021-03-01 NOTE — ED Triage Notes (Signed)
Presents with a sore throat and headache x 2 days. Pt states she has not taken medicine for relief.

## 2021-03-01 NOTE — ED Provider Notes (Signed)
[place]    CSN: [id number] Arrival date & time: 03/01/21  0818      History   Chief Complaint Chief Complaint  Patient presents with   Sore Throat   Headache    HPI Carrie Waters is a 20 y.o. female presenting with sore throat and headache x2 days. Medical history noncontributory. Fevers and chills at home but has not monitored temperature. Has not taken anything for her symptoms. Decreased appetite but tolerating fluids and foods. Fatigue. Denies n/v/d/c. Implant contraception.  HPI  History reviewed. No pertinent past medical history.  There are no problems to display for this patient.   History reviewed. No pertinent surgical history.  OB History   No obstetric history on file.      Home Medications    Prior to Admission medications   Medication Sig Start Date End Date Taking? Authorizing Provider  ondansetron (ZOFRAN ODT) 8 MG disintegrating tablet Take 1 tablet (8 mg total) by mouth every 8 (eight) hours as needed for nausea or vomiting. 03/01/21  Yes Hazel Sams, PA-C  oseltamivir (TAMIFLU) 75 MG capsule Take 1 capsule (75 mg total) by mouth every 12 (twelve) hours. 03/01/21  Yes Hazel Sams, PA-C  cefdinir (OMNICEF) 300 MG capsule Take 1 capsule (300 mg total) by mouth 2 (two) times daily. 01/26/21   Raspet, Derry Skill, PA-C  tiZANidine (ZANAFLEX) 2 MG tablet Take 1 tablet (2 mg total) by mouth every 8 (eight) hours as needed for muscle spasms. 11/27/20   Hazel Sams, PA-C    Family History Family History  Problem Relation Age of Onset   Healthy Mother    Healthy Father     Social History Social History   Tobacco Use   Smoking status: Never   Smokeless tobacco: Never  Vaping Use   Vaping Use: Never used  Substance Use Topics   Alcohol use: Yes   Drug use: Never     Allergies   Sulfa antibiotics   Review of Systems Review of Systems  Constitutional:  Positive for chills and fever. Negative for appetite change.   HENT:  Positive for sore throat. Negative for congestion, ear pain, rhinorrhea, sinus pressure and sinus pain.   Eyes:  Negative for redness and visual disturbance.  Respiratory:  Negative for cough, chest tightness, shortness of breath and wheezing.   Cardiovascular:  Negative for chest pain and palpitations.  Gastrointestinal:  Negative for abdominal pain, constipation, diarrhea, nausea and vomiting.  Genitourinary:  Negative for dysuria, frequency and urgency.  Musculoskeletal:  Negative for myalgias.  Neurological:  Negative for dizziness, weakness and headaches.  Psychiatric/Behavioral:  Negative for confusion.   All other systems reviewed and are negative.   Physical Exam Triage Vital Signs ED Triage Vitals  Enc Vitals Group     BP 03/01/21 0900 108/67     Pulse Rate 03/01/21 0900 (!) 119     Resp 03/01/21 0900 19     Temp 03/01/21 0900 (!) 100.6 F (38.1 C)     Temp Source 03/01/21 0900 Oral     SpO2 03/01/21 0900 96 %     Weight --      Height --      Head Circumference --      Peak Flow --      Pain Score 03/01/21 0859 10     Pain Loc --      Pain Edu? --      Excl. in [place]? --  No data found.  Updated Vital Signs BP 108/67 (BP Location: Right Arm)   Pulse (!) 119   Temp (!) 100.6 F (38.1 C) (Oral)   Resp 19   SpO2 96%   Visual Acuity Right Eye Distance:   Left Eye Distance:   Bilateral Distance:    Right Eye Near:   Left Eye Near:    Bilateral Near:     Physical Exam Vitals reviewed.  Constitutional:      General: She is not in acute distress.    Appearance: Normal appearance. She is ill-appearing.  HENT:     Head: Normocephalic and atraumatic.     Right Ear: Tympanic membrane, ear canal and external ear normal. No tenderness. No middle ear effusion. There is no impacted cerumen. Tympanic membrane is not perforated, erythematous, retracted or bulging.     Left Ear: Tympanic membrane, ear canal and external ear normal. No tenderness.  No middle  ear effusion. There is no impacted cerumen. Tympanic membrane is not perforated, erythematous, retracted or bulging.     Nose: Nose normal. No congestion.     Mouth/Throat:     Mouth: Mucous membranes are moist.     Pharynx: Uvula midline. No oropharyngeal exudate or posterior oropharyngeal erythema.  Eyes:     Extraocular Movements: Extraocular movements intact.     Pupils: Pupils are equal, round, and reactive to light.  Cardiovascular:     Rate and Rhythm: Regular rhythm. Tachycardia present.     Heart sounds: Normal heart sounds.  Pulmonary:     Effort: Pulmonary effort is normal.     Breath sounds: Normal breath sounds. No decreased breath sounds, wheezing, rhonchi or rales.  Abdominal:     Palpations: Abdomen is soft.     Tenderness: There is no abdominal tenderness. There is no guarding or rebound.  Lymphadenopathy:     Cervical: No cervical adenopathy.     Right cervical: No superficial cervical adenopathy.    Left cervical: No superficial cervical adenopathy.  Neurological:     General: No focal deficit present.     Mental Status: She is alert and oriented to person, place, and time.  Psychiatric:        Mood and Affect: Mood normal.        Behavior: Behavior normal.        Thought Content: Thought content normal.        Judgment: Judgment normal.     UC Treatments / Results  Labs (all labs ordered are listed, but only abnormal results are displayed) Labs Reviewed  POC INFLUENZA A AND B ANTIGEN (URGENT CARE ONLY) - Abnormal; Notable for the following components:      Result Value   INFLUENZA A ANTIGEN, POC POSITIVE (*)    All other components within normal limits  CULTURE, GROUP A STREP [facility])  POCT RAPID STREP A, ED / UC  POCT RAPID STREP A, ED / UC    EKG   Radiology No results found.  Procedures Procedures (including critical care time)  Medications Ordered in UC Medications  acetaminophen (TYLENOL) tablet 650 mg (650 mg Oral Given 03/01/21 0906)     Initial Impression / Assessment and Plan / UC Course  I have reviewed the triage vital signs and the nursing notes.  Pertinent labs & imaging results that were available during my care of the patient were reviewed by me and considered in my medical decision making (see chart for details).     This patient is  a very pleasant 20 y.o. year old female presenting with influenza A.  Febrile 100.6, tachycardic at 119, has not taken antipyretic.  Implant contraception.   Rapid strep negative, culture sent. Rapid influenza a positive.  Tamiflu, Zofran, over-the-counter medications.  Good hydration.  ED return precautions discussed. Patient verbalizes understanding and agreement.   Coding Level 4 for acute illness with systemic symptoms, and prescription drug management   Final Clinical Impressions(s) / UC Diagnoses   Final diagnoses:  Influenza due to identified novel influenza A virus with other respiratory manifestations     Discharge Instructions      -Tamiflu twice daily x5 days. This medication can cause nausea, so I also sent nausea medication. You can stop the Tamiflu if you don't like it or if it causes side effects.  -Take the Zofran (ondansetron) up to 3 times daily for nausea and vomiting. -For fevers/chills, bodyaches, headaches- You can take Tylenol up to 1000 mg 3 times daily, and ibuprofen up to 600 mg 3 times daily with food.  You can take these together, or alternate every 3-4 hours. -Drink plenty of water/gatorade and get plenty of rest -With a virus, you're typically contagious for 5-7 days, or as long as you're having fevers.  -Come back and see [place] if things are getting worse instead of better, like shortness of breath, chest pain, fevers and chills that are getting higher instead of lower and do not come down with Tylenol or ibuprofen, etc.    ED Prescriptions     Medication Sig Dispense Auth. Provider   oseltamivir (TAMIFLU) 75 MG capsule Take 1 capsule (75  mg total) by mouth every 12 (twelve) hours. 10 capsule Ignacia Bayley E, PA-C   ondansetron (ZOFRAN ODT) 8 MG disintegrating tablet Take 1 tablet (8 mg total) by mouth every 8 (eight) hours as needed for nausea or vomiting. 20 tablet Rhys Martini, PA-C      PDMP not reviewed this encounter.   Rhys Martini, PA-C 03/01/21 1012

## 2021-03-01 NOTE — ED Notes (Signed)
Placed nasal swab in lab

## 2021-03-01 NOTE — Discharge Instructions (Addendum)
-  Tamiflu twice daily x5 days. This medication can cause nausea, so I also sent nausea medication. You can stop the Tamiflu if you don't like it or if it causes side effects.  -Take the Zofran (ondansetron) up to 3 times daily for nausea and vomiting. -For fevers/chills, bodyaches, headaches- You can take Tylenol up to 1000 mg 3 times daily, and ibuprofen up to 600 mg 3 times daily with food.  You can take these together, or alternate every 3-4 hours. -Drink plenty of water/gatorade and get plenty of rest -With a virus, you're typically contagious for 5-7 days, or as long as you're having fevers.  -Come back and see us if things are getting worse instead of better, like shortness of breath, chest pain, fevers and chills that are getting higher instead of lower and do not come down with Tylenol or ibuprofen, etc.  

## 2021-03-02 LAB — CULTURE, GROUP A STREP (THRC)

## 2021-03-15 ENCOUNTER — Ambulatory Visit: Payer: Self-pay | Admitting: Nurse Practitioner

## 2021-05-09 ENCOUNTER — Other Ambulatory Visit: Payer: Self-pay

## 2021-05-09 ENCOUNTER — Ambulatory Visit (INDEPENDENT_AMBULATORY_CARE_PROVIDER_SITE_OTHER): Payer: Medicaid Other

## 2021-05-09 ENCOUNTER — Encounter (HOSPITAL_COMMUNITY): Payer: Self-pay | Admitting: *Deleted

## 2021-05-09 ENCOUNTER — Ambulatory Visit (HOSPITAL_COMMUNITY)
Admission: EM | Admit: 2021-05-09 | Discharge: 2021-05-09 | Disposition: A | Discharge status: Home / Self Care | Payer: Medicaid Other | Attending: Internal Medicine | Admitting: Internal Medicine

## 2021-05-09 DIAGNOSIS — M940 Chondrocostal junction syndrome [Tietze]: Secondary | ICD-10-CM

## 2021-05-09 DIAGNOSIS — J40 Bronchitis, not specified as acute or chronic: Secondary | ICD-10-CM

## 2021-05-09 DIAGNOSIS — R059 Cough, unspecified: Secondary | ICD-10-CM

## 2021-05-09 MED ORDER — ALBUTEROL SULFATE HFA 108 (90 BASE) MCG/ACT IN AERS: 2.0000 | INHALATION_SPRAY | RESPIRATORY_TRACT | 0 refills | Status: DC | PRN

## 2021-05-09 MED ORDER — BENZONATATE 200 MG PO CAPS: 200.0000 mg | ORAL_CAPSULE | Freq: Three times a day (TID) | ORAL | 0 refills | Status: DC | PRN

## 2021-05-09 MED ORDER — AZITHROMYCIN 250 MG PO TABS: ORAL_TABLET | ORAL | 0 refills | Status: DC

## 2021-05-09 NOTE — ED Provider Notes (Signed)
[facility]    CSN: [id number] Arrival date & time: 05/09/21  1729      History   Chief Complaint Chief Complaint  Patient presents with   Cough   Muscle Pain    HPI Carrie Waters is a 21 y.o. female who presents with worsening productive cough of yellow mucous which started as a cough 2 weeks ago. Today has been feeling chest tightness in central area of chest and feels better when sitting compared to laying. Denies fever, but today has felt fatigued. 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 Denies rhinitis, sweats, and did not do a covid test on herself when she started the sickness.     History reviewed. No pertinent past medical history.  There are no problems to display for this patient.   History reviewed. No pertinent surgical history.  OB History   No obstetric history on file.      Home Medications    Prior to Admission medications   Medication Sig Start Date End Date Taking? Authorizing Provider  albuterol (VENTOLIN HFA) 108 (90 Base) MCG/ACT inhaler Inhale 2 puffs into the lungs every 4 (four) hours as needed for wheezing or shortness of breath. 05/09/21  Yes Rodriguez-Southworth, Nettie Elm, PA-C  azithromycin (ZITHROMAX Z-PAK) 250 MG tablet 2 today, then one qd x 4 05/09/21  Yes Rodriguez-Southworth, Nettie Elm, PA-C  benzonatate (TESSALON) 200 MG capsule Take 1 capsule (200 mg total) by mouth 3 (three) times daily as needed for cough. 05/09/21  Yes Rodriguez-Southworth, Nettie Elm, PA-C    Family History Family History  Problem Relation Age of Onset   Healthy Mother    Healthy Father     Social History Social History   Tobacco Use   Smoking status: Never   Smokeless tobacco: Never  Vaping Use   Vaping Use: Never used  Substance Use Topics   Alcohol use: Yes   Drug use: Never     Allergies   Sulfa antibiotics   Review of Systems Review of Systems  Constitutional:  Positive for fatigue. Negative for activity change, chills,  diaphoresis and fever.  HENT:  Negative for congestion, ear discharge, ear pain, postnasal drip, rhinorrhea and sore throat.   Eyes:  Negative for discharge.  Respiratory:  Positive for cough and chest tightness. Negative for shortness of breath and wheezing.   Musculoskeletal:  Negative for myalgias.  Neurological:  Negative for headaches.    Physical Exam Triage Vital Signs ED Triage Vitals  Enc Vitals Group     BP 05/09/21 1831 118/67     Pulse Rate 05/09/21 1831 89     Resp 05/09/21 1831 18     Temp 05/09/21 1831 99 F (37.2 C)     Temp src --      SpO2 05/09/21 1831 99 %     Weight --      Height --      Head Circumference --      Peak Flow --      Pain Score 05/09/21 1828 7     Pain Loc --      Pain Edu? --      Excl. in [place]? --    No data found.  Updated Vital Signs BP 118/67    Pulse 89    Temp 99 F (37.2 C)    Resp 18    SpO2 99%   Visual Acuity Right Eye Distance:   Left Eye Distance:   Bilateral Distance:    Right  Eye Near:   Left Eye Near:    Bilateral Near:     Physical Exam Vitals and nursing note reviewed.  Constitutional:      General: She is not in acute distress.    Appearance: She is normal weight. She is not toxic-appearing.  HENT:     Head: Normocephalic.     Right Ear: Tympanic membrane, ear canal and external ear normal.     Left Ear: Tympanic membrane, ear canal and external ear normal.     Nose: Nose normal.     Mouth/Throat:     Mouth: Mucous membranes are moist.     Pharynx: Oropharynx is clear.  Eyes:     General: No scleral icterus.    Conjunctiva/sclera: Conjunctivae normal.  Cardiovascular:     Rate and Rhythm: Normal rate and regular rhythm.     Heart sounds: No murmur heard. Pulmonary:     Effort: Pulmonary effort is normal.     Breath sounds: Normal breath sounds.     Comments: I was able to reproduce her chest pain with palpation on the substernal area Chest:     Chest wall: Tenderness present.  Musculoskeletal:         General: Normal range of motion.     Cervical back: Neck supple.  Lymphadenopathy:     Cervical: No cervical adenopathy.  Skin:    General: Skin is warm and dry.     Findings: No rash.  Neurological:     Mental Status: She is alert and oriented to person, place, and time.     Gait: Gait normal.  Psychiatric:        Mood and Affect: Mood normal.        Behavior: Behavior normal.        Thought Content: Thought content normal.        Judgment: Judgment normal.     UC Treatments / Results  Labs (all labs ordered are listed, but only abnormal results are displayed) Labs Reviewed - No data to display  EKG   Radiology DG Chest 2 View  Result Date: 05/09/2021 CLINICAL DATA:  Cough for 2 weeks. EXAM: CHEST - 2 VIEW COMPARISON:  None. FINDINGS: The cardiomediastinal contours are normal. Mild bronchial thickening. Pulmonary vasculature is normal. No consolidation, pleural effusion, or pneumothorax. No acute osseous abnormalities are seen. IMPRESSION: Mild bronchial thickening suggesting bronchitis or asthma. No evidence of pneumonia. Electronically Signed   By: Narda RutherfordMelanie  Sanford M.D.   On: 05/09/2021 19:01    Procedures Procedures (including critical care time)  Medications Ordered in UC Medications - No data to display  Initial Impression / Assessment and Plan / UC Course  I have reviewed the triage vital signs and the nursing notes. Pertinent  imaging results that were available during my care of the patient were reviewed by me and considered in my medical decision making (see chart for details). Has bronchitis and costochondritis.  I placed her on albuterol, tessalon and Azithromycin. See instructions.  Final Clinical Impressions(s) / UC Diagnoses   Final diagnoses:  Bronchitis  Costochondritis     Discharge Instructions      For the chest wall discomfort use Motrin or advil 2-3 times a day for 7 days as needed     ED Prescriptions     Medication Sig Dispense  Auth. Provider   albuterol (VENTOLIN HFA) 108 (90 Base) MCG/ACT inhaler Inhale 2 puffs into the lungs every 4 (four) hours as needed for wheezing or shortness  of breath. 18 g Rodriguez-Southworth, Marceline Napierala, PA-C   azithromycin (ZITHROMAX Z-PAK) 250 MG tablet 2 today, then one qd x 4 6 tablet Rodriguez-Southworth, Kimberlee Shoun, PA-C   benzonatate (TESSALON) 200 MG capsule Take 1 capsule (200 mg total) by mouth 3 (three) times daily as needed for cough. 30 capsule Rodriguez-Southworth, Nettie Elm, PA-C      PDMP not reviewed this encounter.   Garey Ham, [place] 05/09/21 1928

## 2021-05-09 NOTE — ED Triage Notes (Signed)
Pt reports worsening cough with yellow mucous. Pt reports chest wall feels tight but feels better when sitting down.

## 2021-05-09 NOTE — Discharge Instructions (Addendum)
For the chest wall discomfort use Motrin or advil 2-3 times a day for 7 days as needed

## 2021-05-16 ENCOUNTER — Ambulatory Visit (HOSPITAL_COMMUNITY)
Admission: RE | Admit: 2021-05-16 | Discharge: 2021-05-16 | Disposition: A | Discharge status: Home / Self Care | Payer: Medicaid Other | Source: Ambulatory Visit | Attending: Physician Assistant | Admitting: Physician Assistant

## 2021-05-16 ENCOUNTER — Encounter (HOSPITAL_COMMUNITY): Payer: Self-pay

## 2021-05-16 ENCOUNTER — Other Ambulatory Visit: Payer: Self-pay

## 2021-05-16 VITALS — BP 114/71 | HR 74 | Temp 98.5°F | Resp 16 | Ht 59.0 in | Wt 134.0 lb

## 2021-05-16 DIAGNOSIS — Z113 Encounter for screening for infections with a predominantly sexual mode of transmission: Secondary | ICD-10-CM | POA: Diagnosis not present

## 2021-05-16 DIAGNOSIS — Z202 Contact with and (suspected) exposure to infections with a predominantly sexual mode of transmission: Secondary | ICD-10-CM

## 2021-05-16 NOTE — ED Triage Notes (Signed)
Pt reports with c/o STD testing. Denies any symptoms or known exposure, however she does have a "bump" in the vaginal area.

## 2021-05-16 NOTE — ED Provider Notes (Signed)
[place]    CSN: [id number] Arrival date & time: 05/16/21  1755      History   Chief Complaint No chief complaint on file.   HPI Carrie Waters is a 21 y.o. female.   Pt presents for STD screening.  She reports noticing a small bump to her vulva today.  Denies vaginal discharge, itching, pelvic pain, dysuria, flank pain, abdominal pain.  She has tried nothing for the sx.    History reviewed. No pertinent past medical history.  There are no problems to display for this patient.   History reviewed. No pertinent surgical history.  OB History   No obstetric history on file.      Home Medications    Prior to Admission medications   Medication Sig Start Date End Date Taking? Authorizing Provider  albuterol (VENTOLIN HFA) 108 (90 Base) MCG/ACT inhaler Inhale 2 puffs into the lungs every 4 (four) hours as needed for wheezing or shortness of breath. 05/09/21   Rodriguez-Southworth, Sunday Spillers, PA-C  azithromycin (ZITHROMAX Z-PAK) 250 MG tablet 2 today, then one qd x 4 05/09/21   Rodriguez-Southworth, Sunday Spillers, PA-C  benzonatate (TESSALON) 200 MG capsule Take 1 capsule (200 mg total) by mouth 3 (three) times daily as needed for cough. 05/09/21   Rodriguez-Southworth, Sunday Spillers, PA-C    Family History Family History  Problem Relation Age of Onset   Healthy Mother    Healthy Father     Social History Social History   Tobacco Use   Smoking status: Never   Smokeless tobacco: Never  Vaping Use   Vaping Use: Never used  Substance Use Topics   Alcohol use: Yes   Drug use: Never     Allergies   Sulfa antibiotics   Review of Systems Review of Systems  Constitutional:  Negative for chills and fever.  HENT:  Negative for ear pain and sore throat.   Eyes:  Negative for pain and visual disturbance.  Respiratory:  Negative for cough and shortness of breath.   Cardiovascular:  Negative for chest pain and palpitations.  Gastrointestinal:  Negative for abdominal pain  and vomiting.  Genitourinary:  Negative for dysuria, hematuria, vaginal bleeding, vaginal discharge and vaginal pain.  Musculoskeletal:  Negative for arthralgias and back pain.  Skin:  Negative for color change and rash.  Neurological:  Negative for seizures and syncope.  All other systems reviewed and are negative.   Physical Exam Triage Vital Signs ED Triage Vitals  Enc Vitals Group     BP 05/16/21 1807 114/71     Pulse Rate 05/16/21 1807 74     Resp 05/16/21 1807 16     Temp 05/16/21 1807 98.5 F (36.9 C)     Temp Source 05/16/21 1807 Oral     SpO2 05/16/21 1807 99 %     Weight 05/16/21 1805 134 lb (60.8 kg)     Height 05/16/21 1805 4\' 11"  (1.499 m)     Head Circumference --      Peak Flow --      Pain Score 05/16/21 1805 0     Pain Loc --      Pain Edu? --      Excl. in [place]? --    No data found.  Updated Vital Signs BP 114/71 (BP Location: Left Arm)    Pulse 74    Temp 98.5 F (36.9 C) (Oral)    Resp 16    Ht 4\' 11"  (1.499 m)    Wt 134 lb (  60.8 kg)    SpO2 99%    BMI 27.06 kg/m   Visual Acuity Right Eye Distance:   Left Eye Distance:   Bilateral Distance:    Right Eye Near:   Left Eye Near:    Bilateral Near:     Physical Exam Vitals and nursing note reviewed.  Constitutional:      General: She is not in acute distress.    Appearance: She is well-developed.  HENT:     Head: Normocephalic and atraumatic.  Eyes:     Conjunctiva/sclera: Conjunctivae normal.  Cardiovascular:     Rate and Rhythm: Normal rate and regular rhythm.     Heart sounds: No murmur heard. Pulmonary:     Effort: Pulmonary effort is normal. No respiratory distress.     Breath sounds: Normal breath sounds.  Abdominal:     Palpations: Abdomen is soft.     Tenderness: There is no abdominal tenderness.  Genitourinary:    Comments: Small raised bump to left vulva, no fluctuance, redness, or warmth noted.  Musculoskeletal:        General: No swelling.     Cervical back: Neck supple.   Skin:    General: Skin is warm and dry.     Capillary Refill: Capillary refill takes less than 2 seconds.  Neurological:     Mental Status: She is alert.  Psychiatric:        Mood and Affect: Mood normal.     UC Treatments / Results  Labs (all labs ordered are listed, but only abnormal results are displayed) Labs Reviewed  CERVICOVAGINAL ANCILLARY ONLY    EKG   Radiology No results found.  Procedures Procedures (including critical care time)  Medications Ordered in UC Medications - No data to display  Initial Impression / Assessment and Plan / UC Course  I have reviewed the triage vital signs and the nursing notes.  Pertinent labs & imaging results that were available during my care of the patient were reviewed by me and considered in my medical decision making (see chart for details).     Self swab done in clinic.  Labs pending, will treat based on results.  Small bump is likely folliculitis, non fluctuant.  Not consistent with HPV. Advised warm compress. Return precautions discussed.  Final Clinical Impressions(s) / UC Diagnoses   Final diagnoses:  Screening examination for STD (sexually transmitted disease)     Discharge Instructions      Labs pending, will treat based on test results if indicated Abstain from sexual activity until test results are back and treatment is completed if indicated.       ED Prescriptions   None    PDMP not reviewed this encounter.   Ward, Lenise Arena, PA-C 05/16/21 1825

## 2021-05-16 NOTE — Discharge Instructions (Signed)
Labs pending, will treat based on test results if indicated Abstain from sexual activity until test results are back and treatment is completed if indicated.

## 2021-05-17 LAB — CERVICOVAGINAL ANCILLARY ONLY
Bacterial Vaginitis (gardnerella): NEGATIVE
Candida Glabrata: NEGATIVE
Candida Vaginitis: NEGATIVE
Chlamydia: NEGATIVE
Comment: NEGATIVE
Comment: NEGATIVE
Comment: NEGATIVE
Comment: NEGATIVE
Comment: NEGATIVE
Comment: NORMAL
Neisseria Gonorrhea: NEGATIVE
Trichomonas: NEGATIVE

## 2021-07-24 ENCOUNTER — Ambulatory Visit (HOSPITAL_COMMUNITY)
Admission: EM | Admit: 2021-07-24 | Discharge: 2021-07-24 | Disposition: A | Discharge status: Home / Self Care | Payer: Medicaid Other | Attending: Physician Assistant | Admitting: Physician Assistant

## 2021-07-24 ENCOUNTER — Encounter (HOSPITAL_COMMUNITY): Payer: Self-pay

## 2021-07-24 DIAGNOSIS — N898 Other specified noninflammatory disorders of vagina: Secondary | ICD-10-CM | POA: Diagnosis present

## 2021-07-24 MED ORDER — FLUCONAZOLE 150 MG PO TABS: 150.0000 mg | ORAL_TABLET | ORAL | 0 refills | Status: DC | PRN

## 2021-07-24 NOTE — Discharge Instructions (Addendum)
We are going to treat you for a yeast infection.  Take Diflucan today and if you continue to have symptoms taken a second dose 3 days later.  Use loosefitting cotton underwear and hypoallergenic soaps and detergents for symptom relief.  We will contact you with your swab results if we need to arrange additional treatment.  If you develop any additional symptoms including pelvic pain, discharge, fever, nausea, vomiting you need to be seen immediately. ?

## 2021-07-24 NOTE — ED Triage Notes (Signed)
Pt presents with vaginal itching since yesterday.  

## 2021-07-24 NOTE — ED Provider Notes (Signed)
?[facility] ? ? ? ?CSN: [id number] ?Arrival date & time: 07/24/21  1720 ? ? ?  ? ?History   ?Chief Complaint ?Chief Complaint  ?Patient presents with  ? Vaginal Itching  ?  Probably a yeast infection. Would also like to do STD testing - Entered by patient  ? ? ?HPI ?Carrie Waters is a 21 y.o. female.  ? ?Patient presents today with a several day history of vaginal irritation and itching.  She believes this is yeast infection as she has had similar symptoms in the past.  She denies any recent antibiotics.  Denies any changes to personal hygiene products including soaps or detergents.  She denies any significant discharge, pelvic pain, abdominal pain, nausea, vomiting, fever.  She is confident she is not pregnant.  She has not tried any over-the-counter medication for symptom management.  She is sexually active and has no concern for STI but is open to testing.  She declined HIV/hepatitis/syphilis testing today.  Denies any history of diabetes and does not take an SGLT2 inhibitor. ? ? ?History reviewed. No pertinent past medical history. ? ?There are no problems to display for this patient. ? ? ?History reviewed. No pertinent surgical history. ? ?OB History   ?No obstetric history on file. ?  ? ? ? ?Home Medications   ? ?Prior to Admission medications   ?Medication Sig Start Date End Date Taking? Authorizing Provider  ?fluconazole (DIFLUCAN) 150 MG tablet Take 1 tablet (150 mg total) by mouth every 3 (three) days as needed for up to 2 doses. 07/24/21  Yes Terrian Ridlon, Denny Peon K, PA-C  ?albuterol (VENTOLIN HFA) 108 (90 Base) MCG/ACT inhaler Inhale 2 puffs into the lungs every 4 (four) hours as needed for wheezing or shortness of breath. 05/09/21   Rodriguez-Southworth, Nettie Elm, PA-C  ?benzonatate (TESSALON) 200 MG capsule Take 1 capsule (200 mg total) by mouth 3 (three) times daily as needed for cough. 05/09/21   Rodriguez-Southworth, Nettie Elm, PA-C  ? ? ?Family History ?Family History  ?Problem Relation Age of Onset  ?  Healthy Mother   ? Healthy Father   ? ? ?Social History ?Social History  ? ?Tobacco Use  ? Smoking status: Never  ? Smokeless tobacco: Never  ?Vaping Use  ? Vaping Use: Never used  ?Substance Use Topics  ? Alcohol use: Yes  ? Drug use: Never  ? ? ? ?Allergies   ?Sulfa antibiotics ? ? ?Review of Systems ?Review of Systems  ?Constitutional:  Negative for activity change, appetite change, fatigue and fever.  ?Gastrointestinal:  Negative for abdominal pain, diarrhea, nausea and vomiting.  ?Genitourinary:  Positive for vaginal pain (Irritation). Negative for dysuria, frequency, urgency, vaginal bleeding and vaginal discharge.  ?Neurological:  Negative for dizziness, light-headedness and headaches.  ? ? ?Physical Exam ?Triage Vital Signs ?ED Triage Vitals  ?Enc Vitals Group  ?   BP 07/24/21 1743 108/75  ?   Pulse Rate 07/24/21 1743 75  ?   Resp 07/24/21 1743 18  ?   Temp 07/24/21 1743 98.1 ?F (36.7 ?C)  ?   Temp Source 07/24/21 1743 Oral  ?   SpO2 07/24/21 1743 98 %  ?   Weight --   ?   Height --   ?   Head Circumference --   ?   Peak Flow --   ?   Pain Score 07/24/21 1744 0  ?   Pain Loc --   ?   Pain Edu? --   ?   Excl. in  GC? --   ? ?No data found. ? ?Updated Vital Signs ?BP 108/75 (BP Location: Right Arm)   Pulse 75   Temp 98.1 ?F (36.7 ?C) (Oral)   Resp 18   SpO2 98%  ? ?Visual Acuity ?Right Eye Distance:   ?Left Eye Distance:   ?Bilateral Distance:   ? ?Right Eye Near:   ?Left Eye Near:    ?Bilateral Near:    ? ?Physical Exam ?Vitals reviewed.  ?Constitutional:   ?   General: She is awake. She is not in acute distress. ?   Appearance: Normal appearance. She is well-developed. She is not ill-appearing.  ?   Comments: Very pleasant female appears stated age in no acute distress sitting comfortably in exam room  ?HENT:  ?   Head: Normocephalic and atraumatic.  ?Cardiovascular:  ?   Rate and Rhythm: Normal rate and regular rhythm.  ?   Heart sounds: Normal heart sounds, S1 normal and S2 normal. No murmur  heard. ?Pulmonary:  ?   Effort: Pulmonary effort is normal.  ?   Breath sounds: Normal breath sounds. No wheezing, rhonchi or rales.  ?   Comments: Clear to auscultation bilaterally ?Abdominal:  ?   General: Bowel sounds are normal.  ?   Palpations: Abdomen is soft.  ?   Tenderness: There is no abdominal tenderness. There is no right CVA tenderness, left CVA tenderness, guarding or rebound.  ?   Comments: Benign abdominal exam  ?Psychiatric:     ?   Behavior: Behavior is cooperative.  ? ? ? ?UC Treatments / Results  ?Labs ?(all labs ordered are listed, but only abnormal results are displayed) ?Labs Reviewed  ?CERVICOVAGINAL ANCILLARY ONLY  ? ? ?EKG ? ? ?Radiology ?No results found. ? ?Procedures ?Procedures (including critical care time) ? ?Medications Ordered in UC ?Medications - No data to display ? ?Initial Impression / Assessment and Plan / UC Course  ?I have reviewed the triage vital signs and the nursing notes. ? ?Pertinent labs & imaging results that were available during my care of the patient were reviewed by me and considered in my medical decision making (see chart for details). ? ?  ? ?We will empirically treat for yeast given clinical presentation.  Patient was sent in Diflucan with instruction to take 1 tablet as soon as she picks up prescription as second tablet 72 hours after if symptoms persist.  She is to use hypoallergenic soaps and detergents and wear loosefitting cotton underwear.  STI swab collected today-results pending.  We will contact patient if we need to arrange additional treatment.  Discussed that if she develops any significant symptoms including discharge, pelvic pain, abdominal pain, fever, nausea, vomiting she should be seen immediately.  Strict return precautions given to which she expressed understanding. ? ?Final Clinical Impressions(s) / UC Diagnoses  ? ?Final diagnoses:  ?Vaginal itching  ?Vaginal irritation  ? ? ? ?Discharge Instructions   ? ?  ?We are going to treat you for a  yeast infection.  Take Diflucan today and if you continue to have symptoms taken a second dose 3 days later.  Use loosefitting cotton underwear and hypoallergenic soaps and detergents for symptom relief.  We will contact you with your swab results if we need to arrange additional treatment.  If you develop any additional symptoms including pelvic pain, discharge, fever, nausea, vomiting you need to be seen immediately. ? ? ? ?ED Prescriptions   ? ? Medication Sig Dispense Auth. Provider  ? fluconazole (  DIFLUCAN) 150 MG tablet Take 1 tablet (150 mg total) by mouth every 3 (three) days as needed for up to 2 doses. 2 tablet Zaiyah Sottile K, PA-C  ? ?  ? ?PDMP not reviewed this encounter. ?  ?Jeani Hawking, PA-C ?07/24/21 1758 ? ?

## 2021-07-25 LAB — CERVICOVAGINAL ANCILLARY ONLY
Bacterial Vaginitis (gardnerella): NEGATIVE
Candida Glabrata: NEGATIVE
Candida Vaginitis: NEGATIVE
Chlamydia: NEGATIVE
Comment: NEGATIVE
Comment: NEGATIVE
Comment: NEGATIVE
Comment: NEGATIVE
Comment: NEGATIVE
Comment: NORMAL
Neisseria Gonorrhea: NEGATIVE
Trichomonas: NEGATIVE

## 2021-07-26 ENCOUNTER — Ambulatory Visit (HOSPITAL_COMMUNITY)
Admission: RE | Admit: 2021-07-26 | Discharge: 2021-07-26 | Disposition: A | Discharge status: Home / Self Care | Payer: Medicaid Other | Source: Ambulatory Visit | Attending: Internal Medicine | Admitting: Internal Medicine

## 2021-07-26 VITALS — BP 131/92 | HR 122 | Temp 99.2°F | Resp 18

## 2021-07-26 DIAGNOSIS — N898 Other specified noninflammatory disorders of vagina: Secondary | ICD-10-CM | POA: Diagnosis not present

## 2021-07-26 LAB — RPR: RPR Ser Ql: NONREACTIVE

## 2021-07-26 LAB — HIV ANTIBODY (ROUTINE TESTING W REFLEX): HIV Screen 4th Generation wRfx: NONREACTIVE

## 2021-07-26 MED ORDER — DOXYCYCLINE HYCLATE 100 MG PO CAPS: 100.0000 mg | ORAL_CAPSULE | Freq: Two times a day (BID) | ORAL | 0 refills | Status: DC

## 2021-07-26 NOTE — ED Provider Notes (Addendum)
?[facility] ? ? ? ?CSN: [id number] ?Arrival date & time: 07/26/21  8916 ? ? ?  ? ?History   ?Chief Complaint ?Chief Complaint  ?Patient presents with  ? Vaginal Itching  ?  Entered by patient  ? Vaginitis  ? ? ?HPI ?Carrie Waters is a 21 y.o. female.  ? ?Patient presents to urgent care for evaluation of vaginal itching and burning when she urinates for the last 4 days.  She was seen 2 days ago at urgent care with the same complaint and tested for STIs.  Denies abdominal pain, urinary frequency, nausea, and bloating.  States she had sexual intercourse with a new partner 6 days ago does not know if she was exposed to an STI.  Over the last 2 days she states that she has felt a sore spot on her left labia minora appear.  Denies using lubrication or condom during penetrative intercourse 6 days ago.  Currently takes birth control consistently and states that she doubts there is any chance she could be pregnant.  She took prescribed Diflucan and this did not help with vaginal itching.  States that it hurts when she is in a seated position, but the actual sore itself does not hurt.  She has not been able to get a good visualization of sore.  Denies fevers.  Reports anxiety. ? ? ?Vaginal Itching ? ? ?No past medical history on file. ? ?There are no problems to display for this patient. ? ? ?No past surgical history on file. ? ?OB History   ?No obstetric history on file. ?  ? ? ? ?Home Medications   ? ?Prior to Admission medications   ?Medication Sig Start Date End Date Taking? Authorizing Provider  ?doxycycline (VIBRAMYCIN) 100 MG capsule Take 1 capsule (100 mg total) by mouth 2 (two) times daily. 07/26/21  Yes Carlisle Beers, FNP  ?albuterol (VENTOLIN HFA) 108 (90 Base) MCG/ACT inhaler Inhale 2 puffs into the lungs every 4 (four) hours as needed for wheezing or shortness of breath. 05/09/21   Rodriguez-Southworth, Nettie Elm, PA-C  ?benzonatate (TESSALON) 200 MG capsule Take 1 capsule (200 mg total) by mouth 3  (three) times daily as needed for cough. 05/09/21   Rodriguez-Southworth, Nettie Elm, PA-C  ?fluconazole (DIFLUCAN) 150 MG tablet Take 1 tablet (150 mg total) by mouth every 3 (three) days as needed for up to 2 doses. 07/24/21   Raspet, Noberto Retort, PA-C  ? ? ?Family History ?Family History  ?Problem Relation Age of Onset  ? Healthy Mother   ? Healthy Father   ? ? ?Social History ?Social History  ? ?Tobacco Use  ? Smoking status: Never  ? Smokeless tobacco: Never  ?Vaping Use  ? Vaping Use: Never used  ?Substance Use Topics  ? Alcohol use: Yes  ? Drug use: Never  ? ? ? ?Allergies   ?Sulfa antibiotics ? ? ?Review of Systems ?Review of Systems ?Per HPI ? ?Physical Exam ?Triage Vital Signs ?ED Triage Vitals  ?Enc Vitals Group  ?   BP 07/26/21 0941 (!) 131/92  ?   Pulse Rate 07/26/21 0941 (!) 122  ?   Resp 07/26/21 0941 18  ?   Temp 07/26/21 0941 99.2 ?F (37.3 ?C)  ?   Temp src --   ?   SpO2 07/26/21 0941 98 %  ?   Weight --   ?   Height --   ?   Head Circumference --   ?   Peak Flow --   ?  Pain Score 07/26/21 0938 10  ?   Pain Loc --   ?   Pain Edu? --   ?   Excl. in GC? --   ? ?No data found. ? ?Updated Vital Signs ?BP (!) 131/92 (BP Location: Left Arm)   Pulse (!) 122   Temp 99.2 ?F (37.3 ?C)   Resp 18   SpO2 98%  ? ?Visual Acuity ?Right Eye Distance:   ?Left Eye Distance:   ?Bilateral Distance:   ? ?Right Eye Near:   ?Left Eye Near:    ?Bilateral Near:    ? ?Physical Exam ?Exam conducted with a chaperone present (Torrie CMA and Dr. Marlinda Mike).  ?Constitutional:   ?   Appearance: Normal appearance. She is well-groomed. She is not ill-appearing, toxic-appearing or diaphoretic.  ?Cardiovascular:  ?   Rate and Rhythm: Regular rhythm. Tachycardia present.  ?   Heart sounds: Normal heart sounds, S1 normal and S2 normal.  ?Pulmonary:  ?   Effort: Pulmonary effort is normal. No tachypnea.  ?   Breath sounds: Normal breath sounds and air entry.  ?Genitourinary: ?   Exam position: Knee-chest position.  ?   Labia:     ?   Right: No  rash, tenderness, lesion or injury.     ?   Left: Tenderness and lesion present. No rash or injury.   ?   Vagina: No vaginal discharge.  ?   Rectum: Normal.  ?   Comments: Slightly raised bump with swelling and induration surrounding 2 ulcerations to the posterior lateral aspect of the labia minora.  Patient reports small amount of pain when palpating sore at the base of the sore surrounding tissue.  The sore itself is not tender to palpation.  No fluctuance noted to lesion. ? ?No abnormal vaginal discharge noted to GU exam. ? ? ?Skin: ?   General: Skin is warm and dry.  ?Neurological:  ?   General: No focal deficit present.  ?   Mental Status: She is alert and oriented to person, place, and time. Mental status is at baseline.  ?Psychiatric:     ?   Behavior: Behavior is cooperative.  ? ? ? ?UC Treatments / Results  ?Labs ?(all labs ordered are listed, but only abnormal results are displayed) ?Labs Reviewed  ?HSV CULTURE AND TYPING  ?RPR  ?HIV ANTIBODY (ROUTINE TESTING W REFLEX)  ? ? ?EKG ? ? ?Radiology ?No results found. ? ?Procedures ?Procedures (including critical care time) ? ?Medications Ordered in UC ?Medications - No data to display ? ?Initial Impression / Assessment and Plan / UC Course  ?I have reviewed the triage vital signs and the nursing notes. ? ?Pertinent labs & imaging results that were available during my care of the patient were reviewed by me and considered in my medical decision making (see chart for details). ? ?Patient presents to urgent care for evaluation of sore to her left labia minora after being seen 2 days ago for vaginal itching with negative STI testing.  Patient states that she had penetrative sexual intercourse with a new partner 6 days ago on Wednesday, July 20, 2021 and developed a "tingly" feeling and itching 4 days ago on Saturday, July 23, 2021.  Patient declined syphilis and HIV screening when she was seen 2 days ago urgent care. Low suspicion for lesion being related to  syphilis.  However, syphilis testing is indicated today due to painless nature of vaginal lesion.  Herpes culture also sent for lab evaluation.  Patient  placed on doxycycline 100 mg twice daily for 7 days to treat possible bacterial cause and instructed to use warm compresses to area twice to 3 times a day.  Patient agrees with this plan and all questions answered. ? ?Education provided regarding proper lubrication and safe sexual practices. ? ?Heart rate is elevated today, but is in sinus rhythm and benign.  No EKG indicated at this time. ? ? ?Final Clinical Impressions(s) / UC Diagnoses  ? ?Final diagnoses:  ?Vaginal lesion  ? ? ? ?Discharge Instructions   ? ?  ?You have been seen today urgent care for a possible infected lesion to your vagina.  I have prescribed doxycycline for you to take twice a day for 7 days.  If you do develop any diarrhea while taking this medication, you may take a probiotic or eat yogurt.  If you develop a significant yeast infection while taking this medication you have a refill of Diflucan at the pharmacy and you may take this next Monday since your last dose was yesterday. ? ?We tested you for herpes and syphilis today.  If your results are positive you will receive a phone call from us.  You will also receive your results on MyChart.  We will call you if your plan of care has changed based on results. ? ?Please go to the emergency department if your lesion swells significantly, if you develop vomiting that will not stop, or if you develop heart palpitations.  Please follow-up with your primary care provider for further evaluation and management of this. ? ?I have set you up with our PCP assistance program so that you may find a primary care provider in [place] since he will be here for school for the next year and a half.  You should be hearing from someone regarding this very shortly. ? ? ? ? ?ED Prescriptions   ? ? Medication Sig Dispense Auth. Provider  ? doxycycline  (VIBRAMYCIN) 100 MG capsule Take 1 capsule (100 mg total) by mouth 2 (two) times daily. 14 capsule Carlisle BeersStanhope, Kristee Angus M, FNP  ? ?  ? ?PDMP not reviewed this encounter. ?  ?Carlisle BeersStanhope, Aspen Deterding M, FNP ?07/26/21 1128 ? ?  ?S

## 2021-07-26 NOTE — ED Triage Notes (Signed)
Pt is present today with c/o vaginal irritation. Pt states that she noticed a swollen lymph node on the left side of her groin that is causing discomfort. Pt started sx started Saturday  ?

## 2021-07-26 NOTE — Discharge Instructions (Addendum)
You have been seen today urgent care for a possible infected lesion to your vagina.  I have prescribed doxycycline for you to take twice a day for 7 days.  If you do develop any diarrhea while taking this medication, you may take a probiotic or eat yogurt.  If you develop a significant yeast infection while taking this medication you have a refill of Diflucan at the pharmacy and you may take this next Monday since your last dose was yesterday. ? ?We tested you for herpes and syphilis today.  If your results are positive you will receive a phone call from [place].  You will also receive your results on MyChart.  We will call you if your plan of care has changed based on results. ? ?Please go to the emergency department if your lesion swells significantly, if you develop vomiting that will not stop, or if you develop heart palpitations.  Please follow-up with your primary care provider for further evaluation and management of this. ? ?I have set you up with our PCP assistance program so that you may find a primary care provider in [place] since he will be here for school for the next year and a half.  You should be hearing from someone regarding this very shortly. ?

## 2021-07-27 ENCOUNTER — Encounter (HOSPITAL_COMMUNITY): Payer: Self-pay | Admitting: Emergency Medicine

## 2021-07-27 ENCOUNTER — Inpatient Hospital Stay (HOSPITAL_COMMUNITY)
Admission: EM | Admit: 2021-07-27 | Discharge: 2021-08-01 | DRG: 076 | Disposition: A | Discharge status: Home / Self Care | Payer: Medicaid Other | Attending: Internal Medicine | Admitting: Internal Medicine

## 2021-07-27 ENCOUNTER — Emergency Department (HOSPITAL_COMMUNITY): Payer: Medicaid Other

## 2021-07-27 DIAGNOSIS — Z8616 Personal history of COVID-19: Secondary | ICD-10-CM | POA: Diagnosis not present

## 2021-07-27 DIAGNOSIS — R6510 Systemic inflammatory response syndrome (SIRS) of non-infectious origin without acute organ dysfunction: Secondary | ICD-10-CM

## 2021-07-27 DIAGNOSIS — R519 Headache, unspecified: Secondary | ICD-10-CM

## 2021-07-27 DIAGNOSIS — Z882 Allergy status to sulfonamides status: Secondary | ICD-10-CM

## 2021-07-27 DIAGNOSIS — A879 Viral meningitis, unspecified: Secondary | ICD-10-CM | POA: Diagnosis present

## 2021-07-27 DIAGNOSIS — G039 Meningitis, unspecified: Secondary | ICD-10-CM | POA: Diagnosis present

## 2021-07-27 LAB — CBC WITH DIFFERENTIAL/PLATELET
Abs Immature Granulocytes: 0.03 10*3/uL (ref 0.00–0.07)
Basophils Absolute: 0 10*3/uL (ref 0.0–0.1)
Basophils Relative: 0 %
Eosinophils Absolute: 0 10*3/uL (ref 0.0–0.5)
Eosinophils Relative: 0 %
HCT: 41.2 % (ref 36.0–46.0)
Hemoglobin: 13.6 g/dL (ref 12.0–15.0)
Immature Granulocytes: 0 %
Lymphocytes Relative: 7 %
Lymphs Abs: 0.8 10*3/uL (ref 0.7–4.0)
MCH: 28 pg (ref 26.0–34.0)
MCHC: 33 g/dL (ref 30.0–36.0)
MCV: 84.8 fL (ref 80.0–100.0)
Monocytes Absolute: 1 10*3/uL (ref 0.1–1.0)
Monocytes Relative: 9 %
Neutro Abs: 9.9 10*3/uL — ABNORMAL HIGH (ref 1.7–7.7)
Neutrophils Relative %: 84 %
Platelets: 171 10*3/uL (ref 150–400)
RBC: 4.86 MIL/uL (ref 3.87–5.11)
RDW: 13.1 % (ref 11.5–15.5)
WBC: 11.7 10*3/uL — ABNORMAL HIGH (ref 4.0–10.5)
nRBC: 0 % (ref 0.0–0.2)

## 2021-07-27 LAB — BASIC METABOLIC PANEL
Anion gap: 9 (ref 5–15)
BUN: 12 mg/dL (ref 6–20)
CO2: 21 mmol/L — ABNORMAL LOW (ref 22–32)
Calcium: 9.3 mg/dL (ref 8.9–10.3)
Chloride: 106 mmol/L (ref 98–111)
Creatinine, Ser: 0.69 mg/dL (ref 0.44–1.00)
GFR, Estimated: >60 mL/min (ref >60)
Glucose, Bld: 105 mg/dL — ABNORMAL HIGH (ref 70–99)
Potassium: 3.5 mmol/L (ref 3.5–5.1)
Sodium: 136 mmol/L (ref 135–145)

## 2021-07-27 LAB — RESP PANEL BY RT-PCR (FLU A&B, COVID) ARPGX2
Influenza A by PCR: NEGATIVE
Influenza B by PCR: NEGATIVE
SARS Coronavirus 2 by RT PCR: NEGATIVE

## 2021-07-27 LAB — PROTEIN AND GLUCOSE, CSF
Glucose, CSF: 61 mg/dL (ref 40–70)
Total  Protein, CSF: 69 mg/dL — ABNORMAL HIGH (ref 15–45)

## 2021-07-27 MED ORDER — DEXAMETHASONE SODIUM PHOSPHATE 10 MG/ML IJ SOLN
10.0000 mg | Freq: Once | INTRAMUSCULAR | Status: AC
  Administered 2021-07-27: 10 mg via INTRAVENOUS
  Filled 2021-07-27: qty 1

## 2021-07-27 MED ORDER — SODIUM CHLORIDE 0.9 % IV SOLN
2.0000 g | Freq: Once | INTRAVENOUS | Status: AC
  Administered 2021-07-27: 2 g via INTRAVENOUS
  Filled 2021-07-27: qty 20

## 2021-07-27 MED ORDER — VANCOMYCIN HCL 750 MG/150ML IV SOLN
750.0000 mg | Freq: Two times a day (BID) | INTRAVENOUS | Status: DC
Start: 2021-07-28 — End: 2021-07-28
  Administered 2021-07-28: 750 mg via INTRAVENOUS
  Filled 2021-07-27 (×2): qty 150

## 2021-07-27 MED ORDER — ACETAMINOPHEN 325 MG PO TABS
650.0000 mg | ORAL_TABLET | Freq: Four times a day (QID) | ORAL | Status: DC | PRN
Start: 2021-07-27 — End: 2021-08-01
  Administered 2021-07-27 – 2021-07-31 (×5): 650 mg via ORAL
  Filled 2021-07-27 (×5): qty 2

## 2021-07-27 MED ORDER — SODIUM CHLORIDE 0.9 % IV SOLN
2.0000 g | Freq: Two times a day (BID) | INTRAVENOUS | Status: DC
  Administered 2021-07-28 (×2): 2 g via INTRAVENOUS
  Filled 2021-07-27 (×3): qty 20

## 2021-07-27 MED ORDER — METOCLOPRAMIDE HCL 5 MG/ML IJ SOLN
5.0000 mg | Freq: Once | INTRAMUSCULAR | Status: AC
  Administered 2021-07-27: 5 mg via INTRAVENOUS
  Filled 2021-07-27: qty 2

## 2021-07-27 MED ORDER — DIPHENHYDRAMINE HCL 50 MG/ML IJ SOLN
25.0000 mg | Freq: Once | INTRAMUSCULAR | Status: AC
  Administered 2021-07-27: 25 mg via INTRAVENOUS
  Filled 2021-07-27: qty 1

## 2021-07-27 MED ORDER — ONDANSETRON HCL 4 MG PO TABS: 4.0000 mg | ORAL_TABLET | Freq: Four times a day (QID) | ORAL | Status: DC | PRN

## 2021-07-27 MED ORDER — ONDANSETRON HCL 4 MG/2ML IJ SOLN
4.0000 mg | Freq: Four times a day (QID) | INTRAMUSCULAR | Status: DC | PRN
  Administered 2021-07-28 – 2021-07-29 (×4): 4 mg via INTRAVENOUS
  Filled 2021-07-27 (×4): qty 2

## 2021-07-27 MED ORDER — SODIUM CHLORIDE 0.9 % IV BOLUS
1000.0000 mL | Freq: Once | INTRAVENOUS | Status: AC
  Administered 2021-07-27: 1000 mL via INTRAVENOUS

## 2021-07-27 MED ORDER — DEXAMETHASONE SODIUM PHOSPHATE 10 MG/ML IJ SOLN
10.0000 mg | Freq: Four times a day (QID) | INTRAMUSCULAR | Status: DC
  Administered 2021-07-28 – 2021-07-29 (×6): 10 mg via INTRAVENOUS
  Filled 2021-07-27 (×6): qty 1

## 2021-07-27 MED ORDER — VANCOMYCIN HCL IN DEXTROSE 1-5 GM/200ML-% IV SOLN
1000.0000 mg | Freq: Once | INTRAVENOUS | Status: AC
  Administered 2021-07-27: 1000 mg via INTRAVENOUS
  Filled 2021-07-27: qty 200

## 2021-07-27 MED ORDER — KETOROLAC TROMETHAMINE 15 MG/ML IJ SOLN
15.0000 mg | Freq: Four times a day (QID) | INTRAMUSCULAR | Status: DC | PRN
  Administered 2021-07-28 – 2021-07-31 (×8): 15 mg via INTRAVENOUS
  Filled 2021-07-27 (×8): qty 1

## 2021-07-27 MED ORDER — ACETAMINOPHEN 650 MG RE SUPP: 650.0000 mg | Freq: Four times a day (QID) | RECTAL | Status: DC | PRN

## 2021-07-27 MED ORDER — KETOROLAC TROMETHAMINE 30 MG/ML IJ SOLN
30.0000 mg | Freq: Once | INTRAMUSCULAR | Status: AC
  Administered 2021-07-27: 30 mg via INTRAVENOUS
  Filled 2021-07-27: qty 1

## 2021-07-27 MED ORDER — HYDROCODONE-ACETAMINOPHEN 5-325 MG PO TABS
1.0000 | ORAL_TABLET | ORAL | Status: DC | PRN
  Administered 2021-07-28: 2 via ORAL
  Administered 2021-07-28: 1 via ORAL
  Administered 2021-07-29 – 2021-07-30 (×5): 2 via ORAL
  Filled 2021-07-27: qty 1
  Filled 2021-07-27 (×8): qty 2

## 2021-07-27 MED ORDER — ALBUTEROL SULFATE (2.5 MG/3ML) 0.083% IN NEBU: 2.5000 mg | INHALATION_SOLUTION | Freq: Four times a day (QID) | RESPIRATORY_TRACT | Status: DC | PRN

## 2021-07-27 NOTE — Progress Notes (Signed)
Pharmacy Antibiotic Note ? ?Carrie Waters is a 21 y.o. female admitted on 07/27/2021 with r/o meningitis.  Pharmacy has been consulted for Vancomycin dosing. ? ?Plan: ?Vancomycin 750mg  IV q12h to target AUC 400-550 ?Check Vancomycin levels at steady state ?Monitor renal function and cx data  ? ?Height: 4\' 11"  (149.9 cm) ?Weight: 59 kg (130 lb) ?IBW/kg (Calculated) : 43.2 ? ?Temp (24hrs), Avg:101.1 ?F (38.4 ?C), Min:99.2 ?F (37.3 ?C), Max:102.9 ?F (39.4 ?C) ? ?Recent Labs  ?Lab 07/27/21 ?1936  ?WBC 11.7*  ?CREATININE 0.69  ?  ?Estimated Creatinine Clearance: 86.9 mL/min (by C-G formula based on SCr of 0.69 mg/dL).   ? ?Allergies  ?Allergen Reactions  ? Sulfa Antibiotics   ? ? ?Antimicrobials this admission: ?4/12 Rocephin >>  ?4/12 Vancomycin >>  ? ?Dose adjustments this admission: ? ?Microbiology results: ?4/12 BCx:  ?4/12 CSF cx: ? ?Thank you for allowing pharmacy to be a part of this patient?s care. ? ?Netta Cedars PharmD ?07/27/2021 10:34 PM ? ?

## 2021-07-27 NOTE — H&P (Signed)
?History and Physical ? ?Patient Name: Carrie Waters     ZOX:[id number]    DOB: 2000/12/19    DOA: 07/27/2021 ?PCP: Pcp, No  ?Patient coming from: College ? ?Chief Complaint: Headache and neck pain ? ? ? ?HPI: Carrie Waters is a 21 y.o. female, with no significant past medical history who presented to the ER on 07/27/2021 with headache and neck pain ? ?At the time my exam, patient is lethargic, not very interactive with my exam, thus difficult to obtain complete and thorough HPI review of systems.  Apparently was more conversive prior to receiving medication in the ER.  Mother bedside. ? ?Patient is currently at college in the area, and currently busy with sorority rushing.  Several days ago her mom states she felt like she was having a yeast infection.  However beginning yesterday, patient started having headache, located in the posterior region, with neck and back pain.  Pain has been getting worse, described the headache as throbbing.  Admits to some nausea, photophobia, and stiff neck.  No history of migraines.  No head trauma.  Not on any blood thinners.  She denies any sick contacts and her vaccines are up-to-date. ? ? ? ?ED course: ?-Vitals on admission: Heart rate 118, blood pressure 131/98, fever of 102.9 ?F ?-Labs on initial presentation: Sodium 136, potassium 3.5, bicarb 21, glucose 115, BUN 12, creatinine 0.96, WBC 11.7, hemoglobin 13.6, influenza and COVID-negative ?-Imaging obtained on admission: CT of the head showed no acute processes ?-In the ED the patient was given 1 L of fluids, vancomycin, Toradol, Reglan, Benadryl, Decadron, Rocephin, and lumbar puncture was performed.  The hospitalist service was contacted for further evaluation and management. ? ? ? ? ?ROS: Difficult to obtain due to patient status ? ? ? ? ?History reviewed. No pertinent past medical history. ? ?History reviewed. No pertinent surgical history. ? ?Social History: Patient lives at home, in college.  The patient walks without  assistance.  nonsmoker. ? ?Allergies  ?Allergen Reactions  ? Sulfa Antibiotics   ? ? ?Family history: family history includes Healthy in her father and mother. ? ?Prior to Admission medications   ?Medication Sig Start Date End Date Taking? Authorizing Provider  ?albuterol (VENTOLIN HFA) 108 (90 Base) MCG/ACT inhaler Inhale 2 puffs into the lungs every 4 (four) hours as needed for wheezing or shortness of breath. 05/09/21   Rodriguez-Southworth, Nettie ElmSylvia, PA-C  ?benzonatate (TESSALON) 200 MG capsule Take 1 capsule (200 mg total) by mouth 3 (three) times daily as needed for cough. 05/09/21   Rodriguez-Southworth, Nettie ElmSylvia, PA-C  ?doxycycline (VIBRAMYCIN) 100 MG capsule Take 1 capsule (100 mg total) by mouth 2 (two) times daily. 07/26/21   Carlisle BeersStanhope, Catharine M, FNP  ?fluconazole (DIFLUCAN) 150 MG tablet Take 1 tablet (150 mg total) by mouth every 3 (three) days as needed for up to 2 doses. 07/24/21   Raspet, Noberto RetortErin K, PA-C  ? ? ? ? ? ?Physical Exam: ?BP 111/72   Pulse (!) 117   Temp (!) 102.9 ?F (39.4 ?C) (Oral) Comment: MD and nurse are aware  Resp 18   Ht 4\' 11"  (1.499 m)   Wt 59 kg   SpO2 98%   BMI 26.26 kg/m?  ? ?General appearance: Well-developed, adult female, lethargic.   ?Eyes: Anicteric, conjunctiva pink, lids and lashes normal. PERRL.    ?ENT: No nasal deformity, discharge, epistaxis.  Hearing intact.   ?Neck: No neck masses.  Trachea midline.  No thyromegaly/tenderness. ?Skin: Warm and dry.  No jaundice.  No suspicious rashes or lesions. ?Cardiac: Slightly tachycardic, nl S1-S2, no murmurs appreciated.  No LE edema.  Radial and pedal pulses 2+ and symmetric. ?Respiratory: Normal respiratory rate and rhythm.  CTAB without rales or wheezes. ?Abdomen: Abdomen soft.  No tenderness with palpation. No ascites, distension, hepatosplenomegaly.   ?MSK: No deformities or effusions of the large joints of the upper or lower extremities bilaterally.  No cyanosis or clubbing. Marland Kitchen    ?Psych: Awakes but falls back  asleep. ? ? ?Labs on Admission:  I have personally reviewed following labs and imaging studies: ?CBC: ?Recent Labs  ?Lab 07/27/21 ?1936  ?WBC 11.7*  ?NEUTROABS 9.9*  ?HGB 13.6  ?HCT 41.2  ?MCV 84.8  ?PLT 171  ? ?Basic Metabolic Panel: ?Recent Labs  ?Lab 07/27/21 ?1936  ?NA 136  ?K 3.5  ?CL 106  ?CO2 21*  ?GLUCOSE 105*  ?BUN 12  ?CREATININE 0.69  ?CALCIUM 9.3  ? ?GFR: ?Estimated Creatinine Clearance: 86.9 mL/min (by C-G formula based on SCr of 0.69 mg/dL). ? ?Liver Function Tests: ?No results for input(s): AST, ALT, ALKPHOS, BILITOT, PROT, ALBUMIN in the last 168 hours. ?No results for input(s): LIPASE, AMYLASE in the last 168 hours. ?No results for input(s): AMMONIA in the last 168 hours. ?Coagulation Profile: ?No results for input(s): INR, PROTIME in the last 168 hours. ?Cardiac Enzymes: ?No results for input(s): CKTOTAL, CKMB, CKMBINDEX, TROPONINI in the last 168 hours. ?BNP (last 3 results) ?No results for input(s): PROBNP in the last 8760 hours. ?HbA1C: ?No results for input(s): HGBA1C in the last 72 hours. ?CBG: ?No results for input(s): GLUCAP in the last 168 hours. ?Lipid Profile: ?No results for input(s): CHOL, HDL, LDLCALC, TRIG, CHOLHDL, LDLDIRECT in the last 72 hours. ?Thyroid Function Tests: ?No results for input(s): TSH, T4TOTAL, FREET4, T3FREE, THYROIDAB in the last 72 hours. ?Anemia Panel: ?No results for input(s): VITAMINB12, FOLATE, FERRITIN, TIBC, IRON, RETICCTPCT in the last 72 hours. ? ? ?Recent Results (from the past 240 hour(s))  ?Resp Panel by RT-PCR (Flu A&B, Covid) Nasopharyngeal Swab     Status: None  ? Collection Time: 07/27/21  7:36 PM  ? Specimen: Nasopharyngeal Swab; Nasopharyngeal(NP) swabs in vial transport medium  ?Result Value Ref Range Status  ? SARS Coronavirus 2 by RT PCR NEGATIVE NEGATIVE Final  ?  Comment: (NOTE) ?SARS-CoV-2 target nucleic acids are NOT DETECTED. ? ?The SARS-CoV-2 RNA is generally detectable in upper respiratory ?specimens during the acute phase of infection.  The lowest ?concentration of SARS-CoV-2 viral copies this assay can detect is ?138 copies/mL. A negative result does not preclude SARS-Cov-2 ?infection and should not be used as the sole basis for treatment or ?other patient management decisions. A negative result may occur with  ?improper specimen collection/handling, submission of specimen other ?than nasopharyngeal swab, presence of viral mutation(s) within the ?areas targeted by this assay, and inadequate number of viral ?copies(<138 copies/mL). A negative result must be combined with ?clinical observations, patient history, and epidemiological ?information. The expected result is Negative. ? ?Fact Sheet for Patients:  ?BloggerCourse.com ? ?Fact Sheet for Healthcare Providers:  ?SeriousBroker.it ? ?This test is no t yet approved or cleared by the [place] FDA and  ?has been authorized for detection and/or diagnosis of SARS-CoV-2 by ?FDA under an Emergency Use Authorization (EUA). This EUA will remain  ?in effect (meaning this test can be used) for the duration of the ?COVID-19 declaration under Section 564(b)(1) of the Act, 21 ?U.S.C.section 360bbb-3(b)(1), unless the authorization is terminated  ?or revoked sooner.  ? ? ?  ?  Influenza A by PCR NEGATIVE NEGATIVE Final  ? Influenza B by PCR NEGATIVE NEGATIVE Final  ?  Comment: (NOTE) ?The Xpert Xpress SARS-CoV-2/FLU/RSV plus assay is intended as an aid ?in the diagnosis of influenza from Nasopharyngeal swab specimens and ?should not be used as a sole basis for treatment. Nasal washings and ?aspirates are unacceptable for Xpert Xpress SARS-CoV-2/FLU/RSV ?testing. ? ?Fact Sheet for Patients: ?BloggerCourse.com ? ?Fact Sheet for Healthcare Providers: ?SeriousBroker.it ? ?This test is not yet approved or cleared by the [place] FDA and ?has been authorized for detection and/or diagnosis of SARS-CoV-2 by ?FDA  under an Emergency Use Authorization (EUA). This EUA will remain ?in effect (meaning this test can be used) for the duration of the ?COVID-19 declaration under Section 564(b)(1) of the Act, 21 U.S.C. ?section 360bbb-3(b)(1), un

## 2021-07-27 NOTE — ED Triage Notes (Signed)
Per EMS-states she had a sudden onset of a head and neck pain last night-light sensitivity ?

## 2021-07-27 NOTE — ED Provider Notes (Signed)
?[facility]-EMERGENCY DEPT ?Provider Note ? ? ?CSN: [id number] ?Arrival date & time: 07/27/21  1333 ? ?  ? ?History ? ?Chief Complaint  ?Patient presents with  ? Headache  ? ? ?Carrie Waters is a 21 y.o. female presenting from home with onset of headache in her posterior head and lower back pain beginning yesterday evening into this morning.  She says the headache is significant, gradually worsening, throbbing towards the back of her head and also stiffness in her neck, photophobia, nausea.  She is in college but has not been around anyone sick.  Her vaccines are up-to-date.  She has no other significant medical problems.  Denies history of migraines.  Denies head trauma.  Denies blood thinner use.  She reports she had COVID back in November of last year.  She denies any congestion, cough, sore throat, diarrhea, or other symptoms.  She is here with her mother at the bedside ? ?HPI ? ?  ? ?Home Medications ?Prior to Admission medications   ?Medication Sig Start Date End Date Taking? Authorizing Provider  ?albuterol (VENTOLIN HFA) 108 (90 Base) MCG/ACT inhaler Inhale 2 puffs into the lungs every 4 (four) hours as needed for wheezing or shortness of breath. 05/09/21   Rodriguez-Southworth, Nettie ElmSylvia, PA-C  ?benzonatate (TESSALON) 200 MG capsule Take 1 capsule (200 mg total) by mouth 3 (three) times daily as needed for cough. 05/09/21   Rodriguez-Southworth, Nettie ElmSylvia, PA-C  ?doxycycline (VIBRAMYCIN) 100 MG capsule Take 1 capsule (100 mg total) by mouth 2 (two) times daily. 07/26/21   Carlisle BeersStanhope, Catharine M, FNP  ?fluconazole (DIFLUCAN) 150 MG tablet Take 1 tablet (150 mg total) by mouth every 3 (three) days as needed for up to 2 doses. 07/24/21   Raspet, Noberto RetortErin K, PA-C  ?   ? ?Allergies    ?Sulfa antibiotics   ? ?Review of Systems   ?Review of Systems ? ?Physical Exam ?Updated Vital Signs ?BP (!) 142/98 (BP Location: Left Arm)   Pulse 100   Temp 99.2 ?F (37.3 ?C) (Oral)   Resp 16   SpO2 98%  ?Physical  Exam ?Constitutional:   ?   General: She is not in acute distress. ?HENT:  ?   Head: Normocephalic and atraumatic.  ?Eyes:  ?   Conjunctiva/sclera: Conjunctivae normal.  ?   Comments: Photophobia  ?Cardiovascular:  ?   Rate and Rhythm: Normal rate and regular rhythm.  ?Pulmonary:  ?   Effort: Pulmonary effort is normal. No respiratory distress.  ?Abdominal:  ?   General: There is no distension.  ?   Tenderness: There is no abdominal tenderness.  ?Skin: ?   General: Skin is warm and dry.  ?Neurological:  ?   General: No focal deficit present.  ?   Mental Status: She is alert and oriented to person, place, and time. Mental status is at baseline.  ?   Cranial Nerves: No cranial nerve deficit or facial asymmetry.  ?   Comments: Pain with movement of the neck  ?Psychiatric:     ?   Mood and Affect: Mood normal.     ?   Behavior: Behavior normal.  ? ? ?ED Results / Procedures / Treatments   ?Labs ?(all labs ordered are listed, but only abnormal results are displayed) ?Labs Reviewed - No data to display ? ?EKG ?None ? ?Radiology ?CT Head Wo Contrast ? ?Result Date: 07/27/2021 ?CLINICAL DATA:  Headache EXAM: CT HEAD WITHOUT CONTRAST TECHNIQUE: Contiguous axial images were obtained from the base of  the skull through the vertex without intravenous contrast. RADIATION DOSE REDUCTION: This exam was performed according to the departmental dose-optimization program which includes automated exposure control, adjustment of the mA and/or kV according to patient size and/or use of iterative reconstruction technique. COMPARISON:  None. FINDINGS: Brain: No acute intracranial hemorrhage, mass effect, or herniation. No extra-axial fluid collections. No evidence of acute territorial infarct. No hydrocephalus. Vascular: No hyperdense vessel or unexpected calcification. Skull: Normal. Negative for fracture or focal lesion. Sinuses/Orbits: No acute finding. Other: None. IMPRESSION: No acute intracranial process identified. Electronically  Signed   By: Jannifer Hick M.D.   On: 07/27/2021 14:56   ? ?Procedures ?.Lumbar Puncture ? ?Date/Time: 07/27/2021 9:07 PM ?Performed by: Terald Sleeper, MD ?Authorized by: Terald Sleeper, MD  ? ?Consent:  ?  Consent obtained:  Verbal ?  Consent given by:  Patient ?  Risks, benefits, and alternatives were discussed: yes   ?  Risks discussed:  Infection, nerve damage, pain, repeat procedure, bleeding and headache ?  Alternatives discussed:  No treatment ?Universal protocol:  ?  Procedure explained and questions answered to patient or proxy's satisfaction: yes   ?  Site/side marked: yes   ?  Patient identity confirmed:  Arm band ?Pre-procedure details:  ?  Procedure purpose:  Diagnostic ?  Preparation: Patient was prepped and draped in usual sterile fashion   ?Anesthesia:  ?  Anesthesia method:  Local infiltration ?  Local anesthetic:  Lidocaine 1% w/o epi ?Procedure details:  ?  Lumbar space:  L4-L5 interspace ?  Patient position:  R lateral decubitus ?  Needle gauge:  22 ?  Needle length (in):  3.5 ?  Ultrasound guidance: no   ?  Number of attempts:  2 ?  Fluid appearance:  Clear ?  Tubes of fluid:  4 ?  Total volume (ml):  4 ?Post-procedure details:  ?  Puncture site:  Adhesive bandage applied ?  Procedure completion:  Tolerated well, no immediate complications ?Marland KitchenCritical Care ?Performed by: Terald Sleeper, MD ?Authorized by: Terald Sleeper, MD  ? ?Critical care provider statement:  ?  Critical care time (minutes):  45 ?  Critical care time was exclusive of:  Separately billable procedures and treating other patients ?  Critical care was necessary to treat or prevent imminent or life-threatening deterioration of the following conditions:  Sepsis ?  Critical care was time spent personally by me on the following activities:  Ordering and performing treatments and interventions, ordering and review of laboratory studies, ordering and review of radiographic studies, pulse oximetry, review of old charts,  examination of patient and evaluation of patient's response to treatment  ? ? ?Medications Ordered in ED ?Medications - No data to display ? ?ED Course/ Medical Decision Making/ A&P ?Clinical Course as of 07/27/21 2105  ?Wed Jul 27, 2021  ?2104 LP successfully completed, plan for admission.  Patient reports she had Covid-19 in Nov 2022 [MT]  ?  ?Clinical Course User Index ?[MT] Terald Sleeper, MD  ? ?                        ?Medical Decision Making ?Amount and/or Complexity of Data Reviewed ?Labs: ordered. ? ?Risk ?Prescription drug management. ?Decision regarding hospitalization. ? ? ?This patient presents to the ED with concern for headache. This involves an extensive number of treatment options, and is a complaint that carries with it a high risk of complications and morbidity.  The differential  diagnosis includes migraine vs viral syndrome vs meningitis vs other ? ?Additional history obtained from patient's mother at bedside ? ? ?I ordered and personally interpreted labs.  The pertinent results include:  WBC 11.7, BMP unremarkable.  CSF studies pending ? ?I ordered imaging studies including CT head ?I independently visualized and interpreted imaging which showed no CVA or focal infarct.  My suspicion for ICH is low at this time. ?I agree with the radiologist interpretation ? ? ?I ordered medication including IV steroids, migraine meds, antibiotics for meningitis empiric coverage ?I have reviewed the patients home medicines and have made adjustments as needed ? ? ?After the interventions noted above, I reevaluated the patient and found that they have: improved .  Headache improved with meds. ? ?Dispostion: ? ?After consideration of the diagnostic results and the patients response to treatment, I feel that the patent would benefit from medical admission.. ? ? ? ? ? ? ? ? ?Final Clinical Impression(s) / ED Diagnoses ?Final diagnoses:  ?None  ? ? ?Rx / DC Orders ?ED Discharge Orders   ? ? None  ? ?  ? ? ?   ?Terald Sleeper, MD ?07/28/21 [phone] ? ?

## 2021-07-27 NOTE — Progress Notes (Signed)
A consult was received from an ED physician for vancomycin per pharmacy dosing.  The patient's profile has been reviewed for ht/wt/allergies/indication/available labs.  No current weight available but was listed as 60.8kg in January 2023. ?A one time order has been placed for vancomycin 1000 mg.  Further antibiotics/pharmacy consults should be ordered by admitting physician if indicated.       ?                ?Thank you, ?Suzzanne Cloud, PharmD ?07/27/2021  8:15 PM ? ?

## 2021-07-27 NOTE — ED Provider Triage Note (Signed)
Emergency Medicine Provider Triage Evaluation Note ? ?Adisyn Malen Gauze , a 21 y.o. female  was evaluated in triage.  Pt complains of headache. States that same came on gradually yesterday morning and has been worsening since then. She states that the pain is bilateral in nature and radiates into her neck.  Denies any history of similar symptoms in the past.  Denies any fevers or chills.  Endorses associated photophobia.  No vision changes, dizziness, or lightheadedness. ? ?Review of Systems  ?Positive:  ?Negative:  ? ?Physical Exam  ?BP (!) 131/98   Pulse (!) 118   Temp 99.2 ?F (37.3 ?C) (Oral)   Resp 18   SpO2 100%  ?Gen:   Awake, anxious in triage ?Resp:  Normal effort  ?MSK:   Moves extremities without difficulty  ?Other:  Alert and oriented and neurologically intact ? ?Medical Decision Making  ?Medically screening exam initiated at 2:35 PM.  Appropriate orders placed.  Ayiana Copado was informed that the remainder of the evaluation will be completed by another provider, this initial triage assessment does not replace that evaluation, and the importance of remaining in the ED until their evaluation is complete. ? ? ?  ?Silva Bandy, PA-C ?07/27/21 1437 ? ?

## 2021-07-28 ENCOUNTER — Encounter (HOSPITAL_COMMUNITY): Payer: Self-pay | Admitting: Internal Medicine

## 2021-07-28 ENCOUNTER — Inpatient Hospital Stay (HOSPITAL_COMMUNITY): Payer: Medicaid Other

## 2021-07-28 ENCOUNTER — Other Ambulatory Visit: Payer: Self-pay

## 2021-07-28 DIAGNOSIS — G039 Meningitis, unspecified: Secondary | ICD-10-CM | POA: Diagnosis not present

## 2021-07-28 LAB — COMPREHENSIVE METABOLIC PANEL
ALT: 16 U/L (ref 0–44)
AST: 24 U/L (ref 15–41)
Albumin: 4 g/dL (ref 3.5–5.0)
Alkaline Phosphatase: 48 U/L (ref 38–126)
Anion gap: 11 (ref 5–15)
BUN: 12 mg/dL (ref 6–20)
CO2: 21 mmol/L — ABNORMAL LOW (ref 22–32)
Calcium: 9.2 mg/dL (ref 8.9–10.3)
Chloride: 107 mmol/L (ref 98–111)
Creatinine, Ser: 0.5 mg/dL (ref 0.44–1.00)
GFR, Estimated: >60 mL/min (ref >60)
Glucose, Bld: 153 mg/dL — ABNORMAL HIGH (ref 70–99)
Potassium: 4.1 mmol/L (ref 3.5–5.1)
Sodium: 139 mmol/L (ref 135–145)
Total Bilirubin: 0.6 mg/dL (ref 0.3–1.2)
Total Protein: 7.9 g/dL (ref 6.5–8.1)

## 2021-07-28 LAB — CSF CELL COUNT WITH DIFFERENTIAL
Eosinophils, CSF: 1 % (ref 0–1)
Eosinophils, CSF: 2 % — ABNORMAL HIGH (ref 0–1)
Lymphs, CSF: 54 % (ref 40–80)
Lymphs, CSF: 58 % (ref 40–80)
Monocyte-Macrophage-Spinal Fluid: 17 % (ref 15–45)
Monocyte-Macrophage-Spinal Fluid: 25 % (ref 15–45)
Other Cells, CSF: REACTIVE
Other Cells, CSF: REACTIVE
RBC Count, CSF: 105 /mm3 — ABNORMAL HIGH
RBC Count, CSF: 55 /mm3 — ABNORMAL HIGH
Segmented Neutrophils-CSF: 20 % — ABNORMAL HIGH (ref 0–6)
Segmented Neutrophils-CSF: 23 % — ABNORMAL HIGH (ref 0–6)
Tube #: 1
Tube #: 4
WBC, CSF: 340 /mm3 (ref 0–5)
WBC, CSF: 375 /mm3 (ref 0–5)

## 2021-07-28 LAB — CBC
HCT: 42 % (ref 36.0–46.0)
Hemoglobin: 13.6 g/dL (ref 12.0–15.0)
MCH: 28 pg (ref 26.0–34.0)
MCHC: 32.4 g/dL (ref 30.0–36.0)
MCV: 86.6 fL (ref 80.0–100.0)
Platelets: 169 10*3/uL (ref 150–400)
RBC: 4.85 MIL/uL (ref 3.87–5.11)
RDW: 13.2 % (ref 11.5–15.5)
WBC: 9.5 10*3/uL (ref 4.0–10.5)
nRBC: 0 % (ref 0.0–0.2)

## 2021-07-28 LAB — HSV CULTURE AND TYPING

## 2021-07-28 LAB — HIV ANTIBODY (ROUTINE TESTING W REFLEX): HIV Screen 4th Generation wRfx: NONREACTIVE

## 2021-07-28 LAB — MAGNESIUM: Magnesium: 2 mg/dL (ref 1.7–2.4)

## 2021-07-28 LAB — PATHOLOGIST SMEAR REVIEW

## 2021-07-28 LAB — PHOSPHORUS: Phosphorus: 2.5 mg/dL (ref 2.5–4.6)

## 2021-07-28 LAB — HEPATITIS C ANTIBODY: HCV Ab: NONREACTIVE

## 2021-07-28 LAB — LACTIC ACID, PLASMA
Lactic Acid, Venous: 0.8 mmol/L (ref 0.5–1.9)
Lactic Acid, Venous: 0.8 mmol/L (ref 0.5–1.9)

## 2021-07-28 MED ORDER — VANCOMYCIN HCL 750 MG/150ML IV SOLN
750.0000 mg | Freq: Three times a day (TID) | INTRAVENOUS | Status: DC
Start: 2021-07-28 — End: 2021-07-29
  Administered 2021-07-28 – 2021-07-29 (×3): 750 mg via INTRAVENOUS
  Filled 2021-07-28 (×4): qty 150

## 2021-07-28 MED ORDER — ADULT MULTIVITAMIN W/MINERALS CH
1.0000 | ORAL_TABLET | Freq: Every day | ORAL | Status: DC
  Administered 2021-07-28 – 2021-08-01 (×5): 1 via ORAL
  Filled 2021-07-28 (×5): qty 1

## 2021-07-28 MED ORDER — FLUCONAZOLE 150 MG PO TABS
150.0000 mg | ORAL_TABLET | Freq: Once | ORAL | Status: AC
Start: 2021-07-28 — End: 2021-07-28
  Administered 2021-07-28: 150 mg via ORAL
  Filled 2021-07-28: qty 1

## 2021-07-28 MED ORDER — PROSOURCE PLUS PO LIQD
30.0000 mL | Freq: Two times a day (BID) | ORAL | Status: DC
  Administered 2021-07-28 – 2021-08-01 (×7): 30 mL via ORAL
  Filled 2021-07-28 (×6): qty 30

## 2021-07-28 MED ORDER — GADOBUTROL 1 MMOL/ML IV SOLN
6.0000 mL | Freq: Once | INTRAVENOUS | Status: AC | PRN
  Administered 2021-07-28: 6 mL via INTRAVENOUS

## 2021-07-28 MED ORDER — SODIUM CHLORIDE 0.9 % IV SOLN: INTRAVENOUS | Status: DC | PRN

## 2021-07-28 MED ORDER — BOOST / RESOURCE BREEZE PO LIQD CUSTOM
1.0000 | Freq: Three times a day (TID) | ORAL | Status: DC
  Administered 2021-07-28 – 2021-08-01 (×7): 1 via ORAL

## 2021-07-28 MED ORDER — VALACYCLOVIR HCL 500 MG PO TABS
1000.0000 mg | ORAL_TABLET | Freq: Two times a day (BID) | ORAL | Status: DC
  Administered 2021-07-28 – 2021-07-29 (×3): 1000 mg via ORAL
  Filled 2021-07-28 (×3): qty 2

## 2021-07-28 NOTE — Progress Notes (Signed)
?PROGRESS NOTE ? ?Blane Ohara IDP:[id number] DOB: 07-29-2000 DOA: 07/27/2021 ?PCP: Pcp, No ? ?Brief History   ? Carrie Waters is a 21 y.o. female, with no significant past medical history who presented to the ER on 07/27/2021 with headache and neck pain ?  ?At the time my exam, patient is lethargic, not very interactive with my exam, thus difficult to obtain complete and thorough HPI review of systems.  Apparently was more conversive prior to receiving medication in the ER.  Mother bedside. ?  ?Patient is currently at college in the area, and currently busy with sorority rushing.  Several days ago her mom states she felt like she was having a yeast infection.  However beginning yesterday, patient started having headache, located in the posterior region, with neck and back pain.  Pain has been getting worse, described the headache as throbbing.  Admits to some nausea, photophobia, and stiff neck.  No history of migraines.  No head trauma.  Not on any blood thinners.  She denies any sick contacts and her vaccines are up-to-date. ?  ?ED course: ?-Vitals on admission: Heart rate 118, blood pressure 131/98, fever of 102.9 ?F ?-Labs on initial presentation: Sodium 136, potassium 3.5, bicarb 21, glucose 115, BUN 12, creatinine 0.96, WBC 11.7, hemoglobin 13.6, influenza and COVID-negative ?-Imaging obtained on admission: CT of the head showed no acute processes ?-In the ED the patient was given 1 L of fluids, vancomycin, Toradol, Reglan, Benadryl, Decadron, Rocephin, and lumbar puncture was performed.  The hospitalist service was contacted for further evaluation and management. ?  ?The patient states that she is feeling better. Mother is at bedside and agrees that she is more coherent and responsive than she was at admission. ? ?Infectious disease has been consulted. I appreciate their help. Antibiotics have been discontinued as the patient appears to have a viral meningitis. ? ?Consultants  ?Infectious  disease ? ?Procedures  ?LP ? ?Antibiotics  ? ?Anti-infectives (From admission, onward)  ? ? Start     Dose/Rate Route Frequency Ordered Stop  ? 07/29/21 1400  acyclovir (ZOVIRAX) 590 mg in dextrose 5 % 100 mL IVPB       ? 10 mg/kg ? 59 kg ?111.8 mL/hr over 60 Minutes Intravenous Every 8 hours 07/29/21 0940    ? 07/28/21 1500  valACYclovir (VALTREX) tablet 1,000 mg  Status:  Discontinued       ? 1,000 mg Oral 2 times daily 07/28/21 1402 07/29/21 0940  ? 07/28/21 1500  fluconazole (DIFLUCAN) tablet 150 mg       ? 150 mg Oral  Once 07/28/21 1414 07/28/21 1500  ? 07/28/21 1200  vancomycin (VANCOREADY) IVPB 750 mg/150 mL  Status:  Discontinued       ? 750 mg ?150 mL/hr over 60 Minutes Intravenous Every 8 hours 07/28/21 1027 07/29/21 0911  ? 07/28/21 0800  cefTRIAXone (ROCEPHIN) 2 g in sodium chloride 0.9 % 100 mL IVPB  Status:  Discontinued       ? 2 g ?200 mL/hr over 30 Minutes Intravenous Every 12 hours 07/27/21 2128 07/29/21 0911  ? 07/28/21 0400  vancomycin (VANCOREADY) IVPB 750 mg/150 mL  Status:  Discontinued       ? 750 mg ?150 mL/hr over 60 Minutes Intravenous Every 12 hours 07/27/21 2247 07/28/21 1027  ? 07/27/21 2030  vancomycin (VANCOCIN) IVPB 1000 mg/200 mL premix       ? 1,000 mg ?200 mL/hr over 60 Minutes Intravenous  Once 07/27/21 2015 07/27/21 2133  ? 07/27/21  1945  cefTRIAXone (ROCEPHIN) 2 g in sodium chloride 0.9 % 100 mL IVPB       ? 2 g ?200 mL/hr over 30 Minutes Intravenous  Once 07/27/21 1936 07/27/21 2031  ? ?  ? ?Subjective  ?The patient is lying quietly in bed. Her mother is at bedside. She continues to complain of headache and back and neck pain, but states that it is better. ? ?Objective  ? ?Vitals:  ?Vitals:  ? 07/28/21 1131 07/28/21 1512  ?BP: (!) 98/59 117/80  ?Pulse: 97 94  ?Resp:  15  ?Temp: 99.8 ?F (37.7 ?C) 98.5 ?F (36.9 ?C)  ?SpO2: 99% 100%  ? ? ?Exam: ? ?Constitutional:  ?The patient is awake, alert, and oriented x 3. Mild distress from headache and neck pain. ?Eyes:  ?pupils and  irises appear normal ?Neck:  ?Pt is not able to touch her chin to her chest due to pain in back of the neck. ?Respiratory:  ?No increased work of breathing. ?No wheezes, rales, or rhonchi ?No tactile fremitus ?Cardiovascular:  ?Regular rate and rhythm ?No murmurs, ectopy, or gallups. ?No lateral PMI. No thrills. ?Abdomen:  ?Abdomen is soft, non-tender, non-distended ?No hernias, masses, or organomegaly ?Normoactive bowel sounds.  ?Musculoskeletal:  ?No cyanosis, clubbing, or edema ?Skin:  ?No rashes, lesions, ulcers ?palpation of skin: no induration or nodules ?Neurologic:  ?CN 2-12 intact ?Sensation all 4 extremities intact ?Psychiatric:  ?Mental status ?Mood, affect appropriate ?Orientation to person, place, time  ?judgment and insight appear intact ? ?I have personally reviewed the following:  ? ?Today's Data  ?Vitals ? ?Lab Data  ?CBC ?CMP ?CSF fluid: High protein. Consistent with viral meningitis. ? ?Micro Data  ?Blood culture x 2: No growth ?CSF culture: No growth ? ?Imaging  ?CT head ? ?Cardiology Data  ? ? ?Other Data  ? ? ?Scheduled Meds: ? (feeding supplement) PROSource Plus  30 mL Oral BID BM  ? dexamethasone (DECADRON) injection  10 mg Intravenous Q6H  ? feeding supplement  1 Container Oral TID BM  ? multivitamin with minerals  1 tablet Oral Daily  ? valACYclovir  1,000 mg Oral BID  ? ?Continuous Infusions: ? cefTRIAXone (ROCEPHIN)  IV Stopped (07/28/21 4174)  ? vancomycin 750 mg (07/28/21 1131)  ? ? ?Principal Problem: ?  Meningitis ?Active Problems: ?  Headache ?  SIRS (systemic inflammatory response syndrome) (HCC) ? ? 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 Infectious disease was consulted. They recommended discontinuation of antibiotics and continuation of antivirals.  ?SIRS criteria: Tachycardia and fever on admission. Resolving. ?Headaches/Neck pain/Back pain: As needed pain medications is available. ?Nausea and vomiting: Antiemetics available.  She has been advanced to a clear liquid diet. ? ?I have seen and examined this patient myself. I have spent 34 minutes in his evaluation and care. ? ?DVT prophylaxis: SCD's ?Code Status: Full Code ?Family Communication: Mother is at bedside ?Disposition Plan: Home  ? ? ?Kazumi Lachney, DO ?[facility] ?Direct contact: see [email or web address]  ?7PM-7AM contact night coverage as above ?07/28/2021, 7:01 PM  LOS: 1 day  ? LOS: 1 day  ? ? ? ? ? ? ?  ?

## 2021-07-28 NOTE — Progress Notes (Signed)
Pharmacy Antibiotic Note ? ?Carrie Waters is a 21 y.o. female admitted on 07/27/2021 with r/o meningitis.  Pharmacy has been consulted for Vancomycin dosing. ? ?Plan: ?Adjust Vancomycin 750mg  IV q8h for goal trough 15-20 mcg/ml ?Check Vancomycin levels at steady state ?Monitor renal function and cx data  ? ?Height: 4\' 11"  (149.9 cm) ?Weight: 59 kg (130 lb) ?IBW/kg (Calculated) : 43.2 ? ?Temp (24hrs), Avg:99.5 ?F (37.5 ?C), Min:98 ?F (36.7 ?C), Max:102.9 ?F (39.4 ?C) ? ?Recent Labs  ?Lab 07/27/21 ?1936 07/28/21 ?0350  ?WBC 11.7* 9.5  ?CREATININE 0.69 0.50  ? ?  ?Estimated Creatinine Clearance: 86.9 mL/min (by C-G formula based on SCr of 0.5 mg/dL).   ? ?Allergies  ?Allergen Reactions  ? Other Diarrhea  ?  blueberries  ? Sulfa Antibiotics Hives and Rash  ? ? ?Antimicrobials this admission: ?4/12 Rocephin >>  ?4/12 Vancomycin >>  ? ?Dose adjustments this admission: ? ?Microbiology results: ?4/12 BCx: ngtd ?4/12 CSF cx: no organisms seen ? ?Thank you for allowing pharmacy to be a part of this patient?s care. ? ?6/12, PharmD, BCPS ?Pharmacy: [phone] ?07/28/2021 10:27 AM ? ?

## 2021-07-28 NOTE — Progress Notes (Addendum)
Initial Nutrition Assessment ? ?DOCUMENTATION CODES:  ? ?Not applicable ? ?INTERVENTION:  ? ?-Boost Breeze po TID, each supplement provides 250 kcal and 9 grams of protein  ?-30 ml Prosource Plus BID, each supplement provides 100 kcals and 15 grams protein ?-MVI with minerals daily ?-RD will follow for diet advancement and adjust supplement regimen as appropriate ? ?NUTRITION DIAGNOSIS:  ? ?Increased nutrient needs related to acute illness as evidenced by estimated needs. ? ?GOAL:  ? ?Patient will meet greater than or equal to 90% of their needs ? ?MONITOR:  ? ?PO intake, Supplement acceptance, Diet advancement, Labs, Weight trends, Skin, I & O's ? ?REASON FOR ASSESSMENT:  ? ?Malnutrition Screening Tool ?  ? ?ASSESSMENT:  ? ?Carrie Waters is a 21 y.o. female, with no significant past medical history who presented to the ER on 07/27/2021 with headache and neck pain ? ?Pt admitted with possible meningitis.  ? ?Reviewed I/O's: +150 ml x 24 hours ?  ?Pt unavailable at time of visit. Attempted to speak with pt via call to hospital room phone x 2, however, unable to reach. RD unable to obtain further nutrition-related history or complete nutrition-focused physical exam at this time.   ? ?Pt s/p lumbar puncture in ED; results pending.  ? ?Pt currently on a clear liquid diet. No meal completion data currently available to assess.  ? ?Reviewed wt hx; pt has experienced a 3% wt loss over the past 2 months, which is not significant for time frame.  ? ?Pt with increased nutritional needs for acute illness and would benefit from addition of oral nutrition supplements.  ? ?Medications reviewed and include decadron.  ? ?Labs reviewed.  ? ?Diet Order:   ?Diet Order   ? ?       ?  Diet clear liquid Room service appropriate? Yes; Fluid consistency: Thin  Diet effective now       ?  ? ?  ?  ? ?  ? ? ?EDUCATION NEEDS:  ? ?No education needs have been identified at this time ? ?Skin:  Skin Assessment: Reviewed RN Assessment ? ?Last BM:   07/25/21 ? ?Height:  ? ?Ht Readings from Last 1 Encounters:  ?07/27/21 4\' 11"  (1.499 m)  ? ? ?Weight:  ? ?Wt Readings from Last 1 Encounters:  ?07/27/21 59 kg  ? ? ?Ideal Body Weight:  44.7 kg ? ?BMI:  Body mass index is 26.26 kg/m?. ? ?Estimated Nutritional Needs:  ? ?Kcal:  1750-1950 ? ?Protein:  90-105 grams ? ?Fluid:  > 1.7 L ? ? ? ?09/26/21, RD, LDN, CDCES ?Registered Dietitian II ?Certified Diabetes Care and Education Specialist ?Please refer to [facility] for RD and/or RD on-call/weekend/after hours pager  ?

## 2021-07-28 NOTE — Consult Note (Signed)
? ? ?[facility]  ?                                                                                     ? ?Patient Identification: ?Patient Name: Carrie Waters MRN: [id number] [place] Date: 07/27/2021  1:41 PM ?Today's Date: 07/28/2021 ?Reason for consult: meningitis  ?Requesting provider: Ava Swayze ? ?Principal Problem: ?  Meningitis ?Active Problems: ?  Headache ?  SIRS (systemic inflammatory response syndrome) (HCC) ? ? ?Antibiotics:  ?Vancomycin 4/12-c ?Ceftriaxone 4/12-c ? ?Lines/Hardware: ? ?Assessment ?21 Y O female with no significant h/o who presented to the ED on 4/12 with acute onset headache and lower back pain for 1 day in the setting of recent sexual encounter and soreness/ulcer in the labia minora. Headache was associated with neck stiffness and photophobia including nausea/NBNB vomiting. CSF analysis consistent with viral meningitis  ? ?Recommendations  ?Will add HSV1 and 2 PCR to the CSF sample for possible Herpetic meningitis given CSF findings support viral meningitis + multiple RBCs. Would not do IV acyclovir given she has no encephalitis and her headache has been improving without antiviral tx. She is not immunocompromised ? ?Added on Meningitis/Encephalitis PCR panel. ? ?Valcyclovir 1g po BID pending HSV culture of vaginal lesion done on 4/11. However, I did not see any obvious lesions or ulcer on external vaginal exam today except whitish vaginal discharge ? ?One dose Fluconazole for possible yeast infection ? ?Fu CSF and blood cultures. Will consider discontinuing abtx if cultures negative in 48 hrs and continues to improve ? ?Fu MRI brain, CSF VDRL ? ?Dr Candiss Norse to follow from tomorrow ? ?Rest of the management as per the primary team. Please call with questions or concerns.  ?Thank you for the consult ? ?Rosiland Oz, MD ?Infectious Disease Physician ?[facility] ?[address] ?[place], [place] [address] ?Phone: [phone]  Fax: [phone] ? ?__________________________________________________________________________________________________________ ?HPI and Hospital Course: ?36 Y O female with no significant h/o who presented to the ED on 4/12 with acute onset headache and lower back pain for 1 day. Headache was associated with neck stiffness and photophobia including nausea/NBNB vomiting.  ? ?Was seen in the ED 4/9 for vaginal itching and was given fluconazole for possible yeast infection. Seen in the ED 4/11 continued vaginal irrutaion and swollen lymph node in the left groin and urinary burning. Last sexual encounter was with a new partner on 4/5. Noted to have 2 ulcerations in the labia minora with surrounding swelling and induration. Syphilis tested and herpes culture sent. Discharged on doxycycline for 7 days. ? ?Spoke with patient as well as mother at bedside. 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 Denies smoking, alcohol and IVDU. Denies rashes or joint pain. She lives in a 2bhk apartment with a roommate. Has a dog ( puddle). Denies recent travel or sick contact. Mother tells she is up to date with vaccinations. No known immunocompromising condition or on immunosuppressive medication.  ? ?Headache is mostly in the posterior head and down from 10/10 to 5/10 today. Continues  to have neck stiffness and photophobia/low back pain. Denies h/o herpes in the past. She does not appreciate any soreness in the vagina or discharge but her urine stinges.  ? ?Work up so far ?HIV negative ?RPR negative ?Urine GC negative ?SARScov2/Influenza A and B negative  ? ? ?ROS: all systems reviewed with pertinent positives and negatives as listed above ? ?History reviewed. No pertinent past medical history. ? ?History reviewed. No pertinent surgical history. ? ? ?Scheduled Meds: ? dexamethasone  (DECADRON) injection  10 mg Intravenous Q6H  ? ?Continuous Infusions: ? cefTRIAXone (ROCEPHIN)  IV Stopped (07/28/21 [id number])  ? vancomycin 750 mg (07/28/21 1131)  ? ?PRN Meds:.acetaminophen **OR** acetaminophen, albuterol, HYDROcodone-acetaminophen, ketorolac, ondansetron **OR** ondansetron (ZOFRAN) IV ? ?Allergies  ?Allergen Reactions  ? Other Diarrhea  ?  blueberries  ? Sulfa Antibiotics Hives and Rash  ? ?Social History  ? ?Socioeconomic History  ? Marital status: Single  ?  Spouse name: Not on file  ? Number of children: Not on file  ? Years of education: Not on file  ? Highest education level: Not on file  ?Occupational History  ? Not on file  ?Tobacco Use  ? Smoking status: Never  ? Smokeless tobacco: Never  ?Vaping Use  ? Vaping Use: Never used  ?Substance and Sexual Activity  ? Alcohol use: Yes  ? Drug use: Never  ? Sexual activity: Yes  ?  Birth control/protection: Implant  ?Other Topics Concern  ? Not on file  ?Social History Narrative  ? Not on file  ? ?Social Determinants of Health  ? ?Financial Resource Strain: Not on file  ?Food Insecurity: Not on file  ?Transportation Needs: Not on file  ?Physical Activity: Not on file  ?Stress: Not on file  ?Social Connections: Not on file  ?Intimate Partner Violence: Not on file  ? ?Family History  ?Problem Relation Age of Onset  ? Healthy Mother   ? Healthy Father   ? ?Vitals ?BP (!) 98/59   Pulse 97   Temp 99.8 ?F (37.7 ?C) (Oral)   Resp 18   Ht 4\' 11"  (1.499 m)   Wt 59 kg   SpO2 99%   BMI 26.26 kg/m?  ? ? ?Physical Exam ?Constitutional:  eyes closed with hand ?   Comments:  ? ?Cardiovascular:  ?   Rate and Rhythm: Normal rate and regular rhythm.  ?   Heart sounds: ? ?Pulmonary:  ?   Effort: Pulmonary effort is normal.  ?   Comments: bilateral clear air entry ? ?Abdominal:  ?   Palpations: Abdomen is soft.  ?   Tenderness: non distended and non tender  ? ?Musculoskeletal:     ?   General: No swelling or tenderness.  ? ?GU( chaperoned by Colette Ribas  D) ?Minimal whitish discharge on the vaginal vault,  did not seen any obvious lesions or ucler  ? ?Neurological:  ?   General: grossly non focal, awake, alert and oriented, neck rigidity, no vertebral tenderness ? ?Psychiatric:     ?   Mood and Affect: Mood normal.  ? ? ?Pertinent Microbiology ?Results for orders placed or performed during the hospital encounter of 07/27/21  ?Resp Panel by RT-PCR (Flu A&B, Covid) Nasopharyngeal Swab     Status: None  ? Collection Time: 07/27/21  7:36 PM  ? Specimen: Nasopharyngeal Swab; Nasopharyngeal(NP) swabs in vial transport medium  ?Result Value Ref Range Status  ? SARS Coronavirus 2 by RT PCR NEGATIVE NEGATIVE Final  ?  Comment: (NOTE) ?  SARS-CoV-2 target nucleic acids are NOT DETECTED. ? ?The SARS-CoV-2 RNA is generally detectable in upper respiratory ?specimens during the acute phase of infection. The lowest ?concentration of SARS-CoV-2 viral copies this assay can detect is ?138 copies/mL. A negative result does not preclude SARS-Cov-2 ?infection and should not be used as the sole basis for treatment or ?other patient management decisions. A negative result may occur with  ?improper specimen collection/handling, submission of specimen other ?than nasopharyngeal swab, presence of viral mutation(s) within the ?areas targeted by this assay, and inadequate number of viral ?copies(<138 copies/mL). A negative result must be combined with ?clinical observations, patient history, and epidemiological ?information. The expected result is Negative. ? ?Fact Sheet for Patients:  ?EntrepreneurPulse.com.au ? ?Fact Sheet for Healthcare Providers:  ?IncredibleEmployment.be ? ?This test is no t yet approved or cleared by the [place] FDA and  ?has been authorized for detection and/or diagnosis of SARS-CoV-2 by ?FDA under an Emergency Use Authorization (EUA). This EUA will remain  ?in effect (meaning this test can be used) for the duration of  the ?COVID-19 declaration under Section 564(b)(1) of the Act, 21 ?U.S.C.section 360bbb-3(b)(1), unless the authorization is terminated  ?or revoked sooner.  ? ? ?  ? Influenza A by PCR NEGATIVE NEGATIVE Final  ? Influenza B by PCR NEGATIVE N

## 2021-07-29 ENCOUNTER — Encounter (HOSPITAL_COMMUNITY): Payer: Self-pay | Admitting: Internal Medicine

## 2021-07-29 DIAGNOSIS — G039 Meningitis, unspecified: Secondary | ICD-10-CM | POA: Diagnosis not present

## 2021-07-29 LAB — BASIC METABOLIC PANEL
Anion gap: 6 (ref 5–15)
BUN: 20 mg/dL (ref 6–20)
CO2: 24 mmol/L (ref 22–32)
Calcium: 8.8 mg/dL — ABNORMAL LOW (ref 8.9–10.3)
Chloride: 106 mmol/L (ref 98–111)
Creatinine, Ser: 0.62 mg/dL (ref 0.44–1.00)
GFR, Estimated: >60 mL/min (ref >60)
Glucose, Bld: 163 mg/dL — ABNORMAL HIGH (ref 70–99)
Potassium: 3.8 mmol/L (ref 3.5–5.1)
Sodium: 136 mmol/L (ref 135–145)

## 2021-07-29 LAB — CBC WITH DIFFERENTIAL/PLATELET
Abs Immature Granulocytes: 0.05 10*3/uL (ref 0.00–0.07)
Basophils Absolute: 0 10*3/uL (ref 0.0–0.1)
Basophils Relative: 0 %
Eosinophils Absolute: 0 10*3/uL (ref 0.0–0.5)
Eosinophils Relative: 0 %
HCT: 38.1 % (ref 36.0–46.0)
Hemoglobin: 12.8 g/dL (ref 12.0–15.0)
Immature Granulocytes: 0 %
Lymphocytes Relative: 4 %
Lymphs Abs: 0.6 10*3/uL — ABNORMAL LOW (ref 0.7–4.0)
MCH: 28.3 pg (ref 26.0–34.0)
MCHC: 33.6 g/dL (ref 30.0–36.0)
MCV: 84.3 fL (ref 80.0–100.0)
Monocytes Absolute: 0.6 10*3/uL (ref 0.1–1.0)
Monocytes Relative: 4 %
Neutro Abs: 14 10*3/uL — ABNORMAL HIGH (ref 1.7–7.7)
Neutrophils Relative %: 92 %
Platelets: 181 10*3/uL (ref 150–400)
RBC: 4.52 MIL/uL (ref 3.87–5.11)
RDW: 13.1 % (ref 11.5–15.5)
WBC: 15.3 10*3/uL — ABNORMAL HIGH (ref 4.0–10.5)
nRBC: 0 % (ref 0.0–0.2)

## 2021-07-29 LAB — MISC LABCORP TEST (SEND OUT): Labcorp test code: 2013305

## 2021-07-29 MED ORDER — LACTATED RINGERS IV SOLN: INTRAVENOUS | Status: DC

## 2021-07-29 MED ORDER — DEXTROSE 5 % IV SOLN
10.0000 mg/kg | Freq: Three times a day (TID) | INTRAVENOUS | Status: DC
  Administered 2021-07-29 – 2021-07-31 (×6): 590 mg via INTRAVENOUS
  Filled 2021-07-29 (×2): qty 11.8
  Filled 2021-07-29: qty 10
  Filled 2021-07-29 (×7): qty 11.8

## 2021-07-29 NOTE — Progress Notes (Signed)
?   ? ? ? ? ?[facility] ? ?Date of Admission:  07/27/2021   Total days of inpatient antibiotics 2 ? ?Principal Problem: ?  Meningitis ?Active Problems: ?  Headache ?  SIRS (systemic inflammatory response syndrome) (HCC) ?     ?    ?Assessment: ?20 YF admitted with HA and low back pain. In the setting of recent sexual exposure and ulcer on labia minora. ? ?#Likely viral meningitis with nuchal rigidity and photophobia ?-CSF gram stain no organisms, Cx Ngx1, rbc 55 wbc 375 (20% neutrophils/54% lymphs/ 25% mons)  PTN 69, glc61.  ?-MRI showed diffuse leptomeningeal enhancement  pronounced in posterior cerebral hemispheres  suspicious for acute meningitis.  ?-Today, pt had worsening neck pain and photophobia ?Recommendations: ?-D/C valtrex ?-Start Acyclovir(can D/C if HCV PCR negative) as worsening neck pain/HA and photophobia ?-Follow CSF studies and monitor clinically. HSV, VDRL, ME panel pending ? ? ?Microbiology:   ?Antibiotics: ?CTX 4/12-13 ?Valtrex 4/13-p ?Vancomycin 4/12-p ?Cultures: ?Blood ?4/12 NG ?Urine ? ?Other ?CSF gram stain no organisms, Cx Ngx1, rbc 55 wbc 375 (20% neutrophils/54% lymphs/ 25% mons)  PTN 69, glc61. HSV, VDRL, ME panel pending ? ?SUBJECTIVE: ?Resting in bed. Reprots neck pain is worse today.  ? ?Review of Systems: ?Review of Systems  ?All other systems reviewed and are negative. ? ? ?Scheduled Meds: ? (feeding supplement) PROSource Plus  30 mL Oral BID BM  ? feeding supplement  1 Container Oral TID BM  ? multivitamin with minerals  1 tablet Oral Daily  ? ?Continuous Infusions: ? sodium chloride 10 mL/hr at 07/28/21 2030  ? acyclovir 590 mg (07/29/21 1312)  ? lactated ringers 125 mL/hr at 07/29/21 0950  ? ?PRN Meds:.sodium chloride, acetaminophen **OR** acetaminophen, albuterol, HYDROcodone-acetaminophen, ketorolac, ondansetron **OR** ondansetron (ZOFRAN) IV ?Allergies  ?Allergen Reactions  ? Other Diarrhea  ?  blueberries  ? Sulfa Antibiotics Hives and Rash   ? ? ?OBJECTIVE: ?Vitals:  ? 07/28/21 2041 07/29/21 0154 07/29/21 0419 07/29/21 1426  ?BP: 117/77 114/63 102/61 126/82  ?Pulse: 88 90 (!) 103 90  ?Resp: 20 18 20 20   ?Temp: 99.9 ?F (37.7 ?C) 98.9 ?F (37.2 ?C) 99 ?F (37.2 ?C) 98.1 ?F (36.7 ?C)  ?TempSrc:  Oral Oral Oral  ?SpO2: 98% 100% 100% 100%  ?Weight:      ?Height:      ? ?Body mass index is 26.26 kg/m?. ? ?Physical Exam ?Constitutional:   ?   Appearance: Normal appearance.  ?HENT:  ?   Head: Normocephalic and atraumatic.  ?   Right Ear: Tympanic membrane normal.  ?   Left Ear: Tympanic membrane normal.  ?   Nose: Nose normal.  ?   Mouth/Throat:  ?   Mouth: Mucous membranes are moist.  ?Eyes:  ?   Extraocular Movements: Extraocular movements intact.  ?   Conjunctiva/sclera: Conjunctivae normal.  ?   Pupils: Pupils are equal, round, and reactive to light.  ?Cardiovascular:  ?   Rate and Rhythm: Normal rate and regular rhythm.  ?   Heart sounds: No murmur heard. ?  No friction rub. No gallop.  ?Pulmonary:  ?   Effort: Pulmonary effort is normal.  ?   Breath sounds: Normal breath sounds.  ?Abdominal:  ?   General: Abdomen is flat.  ?   Palpations: Abdomen is soft.  ?Musculoskeletal:     ?   General: Normal range of motion.  ?Skin: ?   General: Skin is warm and dry.  ?Neurological:  ?  General: No focal deficit present.  ?   Comments: Photophobia, Nuchal ridgidity  ?Psychiatric:     ?   Mood and Affect: Mood normal.  ? ? ? ? ?Lab Results ?Lab Results  ?Component Value Date  ? WBC 15.3 (H) 07/29/2021  ? HGB 12.8 07/29/2021  ? HCT 38.1 07/29/2021  ? MCV 84.3 07/29/2021  ? PLT 181 07/29/2021  ?  ?Lab Results  ?Component Value Date  ? CREATININE 0.62 07/29/2021  ? BUN 20 07/29/2021  ? NA 136 07/29/2021  ? K 3.8 07/29/2021  ? CL 106 07/29/2021  ? CO2 24 07/29/2021  ?  ?Lab Results  ?Component Value Date  ? ALT 16 07/28/2021  ? AST 24 07/28/2021  ? ALKPHOS 48 07/28/2021  ? BILITOT 0.6 07/28/2021  ?  ? ? ? ? ?Laurice Record, MD ?[facility] ?[facility] ?07/29/2021, 4:41 PM  ?

## 2021-07-29 NOTE — Progress Notes (Signed)
Transition of Care (TOC) Screening Note ? ?Patient Details  ?Name: Carrie Waters ?Date of Birth: May 15, 2000 ? ?Transition of Care (TOC) CM/SW Contact:    ?Ewing Schlein, LCSW ?Phone Number: ?07/29/2021, 9:18 AM ? ?Transition of Care Department [facility]) has reviewed patient and no TOC needs have been identified at this time. We will continue to monitor patient advancement through interdisciplinary progression rounds. If new patient transition needs arise, please place a TOC consult. ?

## 2021-07-29 NOTE — Progress Notes (Signed)
?PROGRESS NOTE ? ?Carrie Waters ZOX:[id number] DOB: 2001-01-30 DOA: 07/27/2021 ?PCP: Pcp, No ? ?Brief History   ? Carrie Waters is a 21 y.o. female, with no significant past medical history who presented to the ER on 07/27/2021 with headache and neck pain ?  ?At the time my exam, patient is lethargic, not very interactive with my exam, thus difficult to obtain complete and thorough HPI review of systems.  Apparently was more conversive prior to receiving medication in the ER.  Mother bedside. ?  ?Patient is currently at college in the area, and currently busy with sorority rushing.  Several days ago her mom states she felt like she was having a yeast infection.  However beginning yesterday, patient started having headache, located in the posterior region, with neck and back pain.  Pain has been getting worse, described the headache as throbbing.  Admits to some nausea, photophobia, and stiff neck.  No history of migraines.  No head trauma.  Not on any blood thinners.  She denies any sick contacts and her vaccines are up-to-date. ?  ?ED course: ?-Vitals on admission: Heart rate 118, blood pressure 131/98, fever of 102.9 ?F ?-Labs on initial presentation: Sodium 136, potassium 3.5, bicarb 21, glucose 115, BUN 12, creatinine 0.96, WBC 11.7, hemoglobin 13.6, influenza and COVID-negative ?-Imaging obtained on admission: CT of the head showed no acute processes ?-In the ED the patient was given 1 L of fluids, vancomycin, Toradol, Reglan, Benadryl, Decadron, Rocephin, and lumbar puncture was performed.  The hospitalist service was contacted for further evaluation and management. ?  ?The patient states that she is feeling better. Mother is at bedside and agrees that she is more coherent and responsive than she was at admission. ? ?Infectious disease has been consulted. I appreciate their help. Antibiotics have been discontinued as the patient appears to have a viral meningitis. ? ?Consultants  ?Infectious  disease ? ?Procedures  ?LP ? ?Antibiotics  ? ?Anti-infectives (From admission, onward)  ? ? Start     Dose/Rate Route Frequency Ordered Stop  ? 07/29/21 1400  acyclovir (ZOVIRAX) 590 mg in dextrose 5 % 100 mL IVPB       ? 10 mg/kg ? 59 kg ?111.8 mL/hr over 60 Minutes Intravenous Every 8 hours 07/29/21 0940    ? 07/28/21 1500  valACYclovir (VALTREX) tablet 1,000 mg  Status:  Discontinued       ? 1,000 mg Oral 2 times daily 07/28/21 1402 07/29/21 0940  ? 07/28/21 1500  fluconazole (DIFLUCAN) tablet 150 mg       ? 150 mg Oral  Once 07/28/21 1414 07/28/21 1500  ? 07/28/21 1200  vancomycin (VANCOREADY) IVPB 750 mg/150 mL  Status:  Discontinued       ? 750 mg ?150 mL/hr over 60 Minutes Intravenous Every 8 hours 07/28/21 1027 07/29/21 0911  ? 07/28/21 0800  cefTRIAXone (ROCEPHIN) 2 g in sodium chloride 0.9 % 100 mL IVPB  Status:  Discontinued       ? 2 g ?200 mL/hr over 30 Minutes Intravenous Every 12 hours 07/27/21 2128 07/29/21 0911  ? 07/28/21 0400  vancomycin (VANCOREADY) IVPB 750 mg/150 mL  Status:  Discontinued       ? 750 mg ?150 mL/hr over 60 Minutes Intravenous Every 12 hours 07/27/21 2247 07/28/21 1027  ? 07/27/21 2030  vancomycin (VANCOCIN) IVPB 1000 mg/200 mL premix       ? 1,000 mg ?200 mL/hr over 60 Minutes Intravenous  Once 07/27/21 2015 07/27/21 2133  ? 07/27/21  1945  cefTRIAXone (ROCEPHIN) 2 g in sodium chloride 0.9 % 100 mL IVPB       ? 2 g ?200 mL/hr over 30 Minutes Intravenous  Once 07/27/21 1936 07/27/21 2031  ? ?  ? ?Subjective  ?The patient is lying quietly in bed. She states that she is slowly feeling better. No new complaints. ? ?Objective  ? ?Vitals:  ?Vitals:  ? 07/29/21 0419 07/29/21 1426  ?BP: 102/61 126/82  ?Pulse: (!) 103 90  ?Resp: 20 20  ?Temp: 99 ?F (37.2 ?C) 98.1 ?F (36.7 ?C)  ?SpO2: 100% 100%  ? ? ?Exam: ? ?Constitutional:  ?The patient is awake, alert, and oriented x 3. Mild distress from headache and neck pain. ?Eyes:  ?pupils and irises appear normal ?Neck:  ?Improved flexion of the  neck, but the patient's is still unable to touch chin to chest due to pain. ?Respiratory:  ?No increased work of breathing. ?No wheezes, rales, or rhonchi ?No tactile fremitus ?Cardiovascular:  ?Regular rate and rhythm ?No murmurs, ectopy, or gallups. ?No lateral PMI. No thrills. ?Abdomen:  ?Abdomen is soft, non-tender, non-distended ?No hernias, masses, or organomegaly ?Normoactive bowel sounds.  ?Musculoskeletal:  ?No cyanosis, clubbing, or edema ?Skin:  ?No rashes, lesions, ulcers ?palpation of skin: no induration or nodules ?Neurologic:  ?CN 2-12 intact ?Sensation all 4 extremities intact ?Psychiatric:  ?Mental status ?Mood, affect appropriate ?Orientation to person, place, time  ?judgment and insight appear intact ? ?I have personally reviewed the following:  ? ?Today's Data  ?Vitals ? ?Lab Data  ?CBC ?CMP ?CSF fluid: High protein. Consistent with viral meningitis. ? ?Micro Data  ?Blood culture x 2: No growth ?CSF culture: No growth ? ?Imaging  ?CT head ? ?Cardiology Data  ? ? ?Other Data  ? ? ?Scheduled Meds: ? (feeding supplement) PROSource Plus  30 mL Oral BID BM  ? feeding supplement  1 Container Oral TID BM  ? multivitamin with minerals  1 tablet Oral Daily  ? ?Continuous Infusions: ? sodium chloride 10 mL/hr at 07/28/21 2030  ? acyclovir 590 mg (07/29/21 1312)  ? lactated ringers 125 mL/hr at 07/29/21 0950  ? ? ?Principal Problem: ?  Meningitis ?Active Problems: ?  Headache ?  SIRS (systemic inflammatory response syndrome) (HCC) ? ? 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 Infectious disease was consulted. They recommended discontinuation of antibiotics and continuation of antivirals. Today infectious disease has changed the patient from valacyclovir to acyclovir due to complaints of increased headache and pain to Dr. Thedore Mins. ?SIRS criteria: Tachycardia and fever on admission. Resolving. ?Headaches/Neck pain/Back pain: As needed pain medications is  available. ?Nausea and vomiting: Antiemetics available. She has been advanced to a clear liquid diet. ? ?I have seen and examined this patient myself. I have spent 34 minutes in his evaluation and care. ? ?DVT prophylaxis: SCD's ?Code Status: Full Code ?Family Communication: Mother is at bedside ?Disposition Plan: Home  ? ? ?Emerald Gehres, DO ?[facility] ?Direct contact: see [email or web address]  ?7PM-7AM contact night coverage as above ?07/29/2021, 5:43 PM  LOS: 1 day  ? LOS: 2 days  ? ? ? ? ? ? ?  ?

## 2021-07-30 LAB — CBC WITH DIFFERENTIAL/PLATELET
Abs Immature Granulocytes: 0.08 10*3/uL — ABNORMAL HIGH (ref 0.00–0.07)
Basophils Absolute: 0 10*3/uL (ref 0.0–0.1)
Basophils Relative: 0 %
Eosinophils Absolute: 0 10*3/uL (ref 0.0–0.5)
Eosinophils Relative: 0 %
HCT: 37.1 % (ref 36.0–46.0)
Hemoglobin: 12.4 g/dL (ref 12.0–15.0)
Immature Granulocytes: 1 %
Lymphocytes Relative: 7 %
Lymphs Abs: 0.9 10*3/uL (ref 0.7–4.0)
MCH: 28.2 pg (ref 26.0–34.0)
MCHC: 33.4 g/dL (ref 30.0–36.0)
MCV: 84.3 fL (ref 80.0–100.0)
Monocytes Absolute: 0.9 10*3/uL (ref 0.1–1.0)
Monocytes Relative: 7 %
Neutro Abs: 10.8 10*3/uL — ABNORMAL HIGH (ref 1.7–7.7)
Neutrophils Relative %: 85 %
Platelets: 194 10*3/uL (ref 150–400)
RBC: 4.4 MIL/uL (ref 3.87–5.11)
RDW: 12.8 % (ref 11.5–15.5)
WBC: 12.6 10*3/uL — ABNORMAL HIGH (ref 4.0–10.5)
nRBC: 0 % (ref 0.0–0.2)

## 2021-07-30 LAB — BASIC METABOLIC PANEL
Anion gap: 6 (ref 5–15)
BUN: 17 mg/dL (ref 6–20)
CO2: 26 mmol/L (ref 22–32)
Calcium: 8.8 mg/dL — ABNORMAL LOW (ref 8.9–10.3)
Chloride: 107 mmol/L (ref 98–111)
Creatinine, Ser: 0.62 mg/dL (ref 0.44–1.00)
GFR, Estimated: >60 mL/min (ref >60)
Glucose, Bld: 139 mg/dL — ABNORMAL HIGH (ref 70–99)
Potassium: 3.9 mmol/L (ref 3.5–5.1)
Sodium: 139 mmol/L (ref 135–145)

## 2021-07-30 LAB — HSV 1/2 PCR, CSF
HSV-1 DNA: NEGATIVE
HSV-2 DNA: POSITIVE — AB

## 2021-07-30 MED ORDER — OXYCODONE HCL 5 MG PO TABS
5.0000 mg | ORAL_TABLET | ORAL | Status: DC | PRN
  Administered 2021-07-30 – 2021-07-31 (×2): 5 mg via ORAL
  Filled 2021-07-30 (×2): qty 1

## 2021-07-30 MED ORDER — ACYCLOVIR 5 % EX CREA: TOPICAL_CREAM | CUTANEOUS | Status: DC

## 2021-07-31 LAB — CBC WITH DIFFERENTIAL/PLATELET
Abs Immature Granulocytes: 0.05 10*3/uL (ref 0.00–0.07)
Basophils Absolute: 0 10*3/uL (ref 0.0–0.1)
Basophils Relative: 0 %
Eosinophils Absolute: 0 10*3/uL (ref 0.0–0.5)
Eosinophils Relative: 0 %
HCT: 37.8 % (ref 36.0–46.0)
Hemoglobin: 12.4 g/dL (ref 12.0–15.0)
Immature Granulocytes: 1 %
Lymphocytes Relative: 28 %
Lymphs Abs: 2.8 10*3/uL (ref 0.7–4.0)
MCH: 27.7 pg (ref 26.0–34.0)
MCHC: 32.8 g/dL (ref 30.0–36.0)
MCV: 84.6 fL (ref 80.0–100.0)
Monocytes Absolute: 1.2 10*3/uL — ABNORMAL HIGH (ref 0.1–1.0)
Monocytes Relative: 12 %
Neutro Abs: 5.7 10*3/uL (ref 1.7–7.7)
Neutrophils Relative %: 59 %
Platelets: 172 10*3/uL (ref 150–400)
RBC: 4.47 MIL/uL (ref 3.87–5.11)
RDW: 12.5 % (ref 11.5–15.5)
WBC: 9.8 10*3/uL (ref 4.0–10.5)
nRBC: 0 % (ref 0.0–0.2)

## 2021-07-31 LAB — BASIC METABOLIC PANEL
Anion gap: 6 (ref 5–15)
BUN: 12 mg/dL (ref 6–20)
CO2: 25 mmol/L (ref 22–32)
Calcium: 8.4 mg/dL — ABNORMAL LOW (ref 8.9–10.3)
Chloride: 107 mmol/L (ref 98–111)
Creatinine, Ser: 0.69 mg/dL (ref 0.44–1.00)
GFR, Estimated: >60 mL/min (ref >60)
Glucose, Bld: 87 mg/dL (ref 70–99)
Potassium: 3.6 mmol/L (ref 3.5–5.1)
Sodium: 138 mmol/L (ref 135–145)

## 2021-07-31 LAB — CSF CULTURE W GRAM STAIN
Culture: NO GROWTH
Special Requests: NORMAL

## 2021-07-31 MED ORDER — VALACYCLOVIR HCL 500 MG PO TABS
1000.0000 mg | ORAL_TABLET | Freq: Three times a day (TID) | ORAL | Status: DC
Start: 2021-07-31 — End: 2021-08-01
  Administered 2021-07-31 – 2021-08-01 (×3): 1000 mg via ORAL
  Filled 2021-07-31 (×4): qty 2

## 2021-07-31 NOTE — Plan of Care (Signed)
#  HSV-2 meningitis ?Spoke with primary, pt's symptoms noted to be improving. Ok to  transition to Valtrex 1gm q8h to complete 10 days on discharge.  ?

## 2021-07-31 NOTE — Progress Notes (Signed)
?PROGRESS NOTE ? ?Carrie Waters VOZ:[id number] DOB: 11-28-2000 DOA: 07/27/2021 ?PCP: Pcp, No ? ?Brief History   ? Carrie Waters is a 21 y.o. female, with no significant past medical history who presented to the ER on 07/27/2021 with headache and neck pain ?  ?At the time my exam, patient is lethargic, not very interactive with my exam, thus difficult to obtain complete and thorough HPI review of systems.  Apparently was more conversive prior to receiving medication in the ER.  Mother bedside. ?  ?Patient is currently at college in the area, and currently busy with sorority rushing.  Several days ago her mom states she felt like she was having a yeast infection.  However beginning yesterday, patient started having headache, located in the posterior region, with neck and back pain.  Pain has been getting worse, described the headache as throbbing.  Admits to some nausea, photophobia, and stiff neck.  No history of migraines.  No head trauma.  Not on any blood thinners.  She denies any sick contacts and her vaccines are up-to-date. ?  ?ED course: ?-Vitals on admission: Heart rate 118, blood pressure 131/98, fever of 102.9 ?F ?-Labs on initial presentation: Sodium 136, potassium 3.5, bicarb 21, glucose 115, BUN 12, creatinine 0.96, WBC 11.7, hemoglobin 13.6, influenza and COVID-negative ?-Imaging obtained on admission: CT of the head showed no acute processes ?-In the ED the patient was given 1 L of fluids, vancomycin, Toradol, Reglan, Benadryl, Decadron, Rocephin, and lumbar puncture was performed.  The hospitalist service was contacted for further evaluation and management. ?  ?Today the patient is feeling better. She states that pain is improved. She has improved mobility of the neck. The patient was discussed with Dr. Thedore Mins of ID. She will be converted to oral Valtrex. ? ?Consultants  ?Infectious disease ? ?Procedures  ?LP ? ?Antibiotics  ? ?Anti-infectives (From admission, onward)  ? ? Start     Dose/Rate Route  Frequency Ordered Stop  ? 07/31/21 1600  valACYclovir (VALTREX) tablet 1,000 mg       ? 1,000 mg Oral 3 times daily 07/31/21 1324    ? 07/29/21 1400  acyclovir (ZOVIRAX) 590 mg in dextrose 5 % 100 mL IVPB  Status:  Discontinued       ? 10 mg/kg ? 59 kg ?111.8 mL/hr over 60 Minutes Intravenous Every 8 hours 07/29/21 0940 07/31/21 1324  ? 07/28/21 1500  valACYclovir (VALTREX) tablet 1,000 mg  Status:  Discontinued       ? 1,000 mg Oral 2 times daily 07/28/21 1402 07/29/21 0940  ? 07/28/21 1500  fluconazole (DIFLUCAN) tablet 150 mg       ? 150 mg Oral  Once 07/28/21 1414 07/28/21 1500  ? 07/28/21 1200  vancomycin (VANCOREADY) IVPB 750 mg/150 mL  Status:  Discontinued       ? 750 mg ?150 mL/hr over 60 Minutes Intravenous Every 8 hours 07/28/21 1027 07/29/21 0911  ? 07/28/21 0800  cefTRIAXone (ROCEPHIN) 2 g in sodium chloride 0.9 % 100 mL IVPB  Status:  Discontinued       ? 2 g ?200 mL/hr over 30 Minutes Intravenous Every 12 hours 07/27/21 2128 07/29/21 0911  ? 07/28/21 0400  vancomycin (VANCOREADY) IVPB 750 mg/150 mL  Status:  Discontinued       ? 750 mg ?150 mL/hr over 60 Minutes Intravenous Every 12 hours 07/27/21 2247 07/28/21 1027  ? 07/27/21 2030  vancomycin (VANCOCIN) IVPB 1000 mg/200 mL premix       ?  1,000 mg ?200 mL/hr over 60 Minutes Intravenous  Once 07/27/21 2015 07/27/21 2133  ? 07/27/21 1945  cefTRIAXone (ROCEPHIN) 2 g in sodium chloride 0.9 % 100 mL IVPB       ? 2 g ?200 mL/hr over 30 Minutes Intravenous  Once 07/27/21 1936 07/27/21 2031  ? ?  ? ?Subjective  ?The patient is lying quietly in bed. She states that she is feeling better and wants to be advanced to a regular diet. ? ?Objective  ? ?Vitals:  ?Vitals:  ? 07/30/21 2102 07/31/21 0540  ?BP: 112/74 109/62  ?Pulse: 80 77  ?Resp: (!) 21 18  ?Temp: 98.8 ?F (37.1 ?C) 98.6 ?F (37 ?C)  ?SpO2: 100% 99%  ? ? ?Exam: ? ?Constitutional:  ?The patient is awake, alert, and oriented x 3. No acute distress ?Neck:  ?Improved flexion of the neck. ?Respiratory:  ?No  increased work of breathing. ?No wheezes, rales, or rhonchi ?No tactile fremitus ?Cardiovascular:  ?Regular rate and rhythm ?No murmurs, ectopy, or gallups. ?No lateral PMI. No thrills. ?Abdomen:  ?Abdomen is soft, non-tender, non-distended ?No hernias, masses, or organomegaly ?Normoactive bowel sounds.  ?Musculoskeletal:  ?No cyanosis, clubbing, or edema ?Skin:  ?No rashes, lesions, ulcers ?palpation of skin: no induration or nodules ?Neurologic:  ?CN 2-12 intact ?Sensation all 4 extremities intact ?Psychiatric:  ?Mental status ?Mood, affect appropriate ?Orientation to person, place, time  ?judgment and insight appear intact ? ?I have personally reviewed the following:  ? ?Today's Data  ?Vitals ? ?Lab Data  ?CBC ?CMP ?CSF fluid: High protein. Consistent with viral meningitis. ? ?Micro Data  ?Blood culture x 2: No growth ?CSF culture: No growth ?HSV -2 positive ? ?Imaging  ?CT head ? ?Cardiology Data  ? ? ?Other Data  ? ? ?Scheduled Meds: ? (feeding supplement) PROSource Plus  30 mL Oral BID BM  ? feeding supplement  1 Container Oral TID BM  ? multivitamin with minerals  1 tablet Oral Daily  ? valACYclovir  1,000 mg Oral TID  ? ?Continuous Infusions: ? sodium chloride 10 mL/hr at 07/28/21 2030  ? ? ?Principal Problem: ?  Meningitis ?Active Problems: ?  Headache ?  SIRS (systemic inflammatory response syndrome) (HCC) ? ? 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 Infectious disease was consulted. They recommended discontinuation of antibiotics and continuation of antivirals. The patient was transition to acyclovir by Dr. Thedore Mins. She has improved in the last 2 days. I have discussed her with Dr. Thedore Mins. She will be transitioned to oral acyclovir today. Diet has been advanced to a regular diet.  ?SIRS criteria: Tachycardia and fever on admission. Resolving. ?Headaches/Neck pain/Back pain: As needed pain medications is available. ?Nausea and vomiting: Antiemetics  available. She has been advanced to a clear liquid diet. ? ?I have seen and examined this patient myself. I have spent 34 minutes in his evaluation and care. ? ?DVT prophylaxis: SCD's ?Code Status: Full Code ?Family Communication: Mother is at bedside ?Disposition Plan: Home  ? ? ?Carrie Heide, DO ?[facility] ?Direct contact: see [email or web address]  ?7PM-7AM contact night coverage as above ?07/31/2021, 1:33 PM  LOS: 1 day  ? LOS: 4 days  ? ? ? ? ? ? ?  ?

## 2021-08-01 LAB — CULTURE, BLOOD (ROUTINE X 2)
Culture: NO GROWTH
Culture: NO GROWTH
Special Requests: ADEQUATE

## 2021-08-01 LAB — VDRL, CSF: VDRL Quant, CSF: NONREACTIVE

## 2021-08-01 MED ORDER — ACETAMINOPHEN 325 MG PO TABS
650.0000 mg | ORAL_TABLET | Freq: Four times a day (QID) | ORAL | 0 refills | Status: DC | PRN
Start: 2021-08-01 — End: 2021-11-29

## 2021-08-01 MED ORDER — VALACYCLOVIR HCL 1 G PO TABS
1000.0000 mg | ORAL_TABLET | Freq: Three times a day (TID) | ORAL | 0 refills | Status: AC
Start: 2021-08-01 — End: 2021-08-10

## 2021-08-01 MED ORDER — ADULT MULTIVITAMIN W/MINERALS CH: 1.0000 | ORAL_TABLET | Freq: Every day | ORAL | 0 refills | Status: DC

## 2021-08-01 NOTE — Plan of Care (Signed)
Discharge instructions given to the patient including medications. Unable to find printed prescription for multivitamin as per AVS, sent message through secure chat and paged twice to the provider, did not receive a response. Pt. Educated and advised to call PCP for any concerns.  ?

## 2021-08-01 NOTE — Discharge Summary (Signed)
?Physician Discharge Summary ?  ?Patient: Carrie Waters MRN: [id number] DOB: 10/07/2000  ?Admit date:     07/27/2021  ?Discharge date: 08/01/2021  ?Discharge Physician: Eliany Mccarter  ? ?PCP: Pcp, No  ? ?Recommendations at discharge:  ? ? Complete course of Valtrex. ?Follow up with PCP in 7-10 days. ? ?Discharge Diagnoses: ?Principal Problem: ?  Meningitis ?Active Problems: ?  Headache ?  SIRS (systemic inflammatory response syndrome) (HCC) ? ?Hospital Course: ?Carrie Waters is a 21 y.o. female, with no significant past medical history who presented to the ER on 07/27/2021 with headache and neck pain ?  ?At the time my exam, patient is lethargic, not very interactive with my exam, thus difficult to obtain complete and thorough HPI review of systems.  Apparently was more conversive prior to receiving medication in the ER.  Mother bedside. ?  ?Patient is currently at college in the area, and currently busy with sorority rushing.  Several days ago her mom states she felt like she was having a yeast infection.  However beginning yesterday, patient started having headache, located in the posterior region, with neck and back pain.  Pain has been getting worse, described the headache as throbbing.  Admits to some nausea, photophobia, and stiff neck.  No history of migraines.  No head trauma.  Not on any blood thinners.  She denies any sick contacts and her vaccines are up-to-date. ?  ?ED course: ?-Vitals on admission: Heart rate 118, blood pressure 131/98, fever of 102.9 ?F ?-Labs on initial presentation: Sodium 136, potassium 3.5, bicarb 21, glucose 115, BUN 12, creatinine 0.96, WBC 11.7, hemoglobin 13.6, influenza and COVID-negative ?-Imaging obtained on admission: CT of the head showed no acute processes ?-In the ED the patient was given 1 L of fluids, vancomycin, Toradol, Reglan, Benadryl, Decadron, Rocephin, and lumbar puncture was performed.  The hospitalist service was contacted for further evaluation and management. ?   ?Today the patient is feeling better. She states that pain is improved. She has improved mobility of the neck. The patient was discussed with Dr. Candiss Norse of ID. She has been converted to oral Valtrex. ? ?The patient will be discharged to home in fair condition. ? 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 Infectious disease was consulted. They recommended discontinuation of antibiotics and continuation of antivirals. The patient was transition to acyclovir by Dr. Candiss Norse. She has improved in the last 2 days. I have discussed her with Dr. Candiss Norse. She will be transitioned to oral acyclovir today. Diet has been advanced to a regular diet.  ?SIRS criteria: Tachycardia and fever on admission. Resolving. ?Headaches/Neck pain/Back pain: As needed pain medications is available. ?Nausea and vomiting: Antiemetics available. She has been advanced to a clear liquid diet. ?  ?Consultants: Infectious disease ?Procedures performed: Lumbar puncture  ?Disposition: Home ?Diet recommendation:  ?Discharge Diet Orders (From admission, onward)  ? ?  Start     Ordered  ? 08/01/21 0000  Diet - low sodium heart healthy       ? 08/01/21 1109  ? ?  ?  ? ?  ? ?Regular diet ?DISCHARGE MEDICATION: ?Allergies as of 08/01/2021   ? ?   Reactions  ? Other Diarrhea  ? blueberries  ? Sulfa Antibiotics Hives, Rash  ? ?  ? ?  ?Medication List  ?  ? ?STOP taking these medications   ? ?benzonatate 200 MG capsule ?Commonly known as: TESSALON ?  ?doxycycline 100 MG capsule ?Commonly known as:  VIBRAMYCIN ?  ?fluconazole 150 MG tablet ?Commonly known as: Diflucan ?  ? ?  ? ?TAKE these medications   ? ?acetaminophen 325 MG tablet ?Commonly known as: TYLENOL ?Take 2 tablets (650 mg total) by mouth every 6 (six) hours as needed for mild pain (or Fever >/= 101). ?  ?albuterol 108 (90 Base) MCG/ACT inhaler ?Commonly known as: VENTOLIN HFA ?Inhale 2 puffs into the lungs every 4 (four) hours as needed for  wheezing or shortness of breath. ?  ?multivitamin with minerals Tabs tablet ?Take 1 tablet by mouth daily. ?Start taking on: August 02, 2021 ?  ?valACYclovir 1000 MG tablet ?Commonly known as: VALTREX ?Take 1 tablet (1,000 mg total) by mouth 3 (three) times daily for 9 days. ?  ? ?  ? ? ?Discharge Exam: ?Filed Weights  ? 07/27/21 2017  ?Weight: 59 kg  ? ?Vitals:  ? 08/01/21 0701 08/01/21 1421  ?BP: 101/65 114/72  ?Pulse: 90 91  ?Resp: 16 18  ?Temp: 98.8 ?F (37.1 ?C) 98.6 ?F (37 ?C)  ?SpO2: 100% 100%  ? ?Exam: ?  ?Constitutional:  ?The patient is awake, alert, and oriented x 3. No acute distress ?Neck:  ?Improved flexion of the neck. ?Respiratory:  ?No increased work of breathing. ?No wheezes, rales, or rhonchi ?No tactile fremitus ?Cardiovascular:  ?Regular rate and rhythm ?No murmurs, ectopy, or gallups. ?No lateral PMI. No thrills. ?Abdomen:  ?Abdomen is soft, non-tender, non-distended ?No hernias, masses, or organomegaly ?Normoactive bowel sounds.  ?Musculoskeletal:  ?No cyanosis, clubbing, or edema ?Skin:  ?No rashes, lesions, ulcers ?palpation of skin: no induration or nodules ?Neurologic:  ?CN 2-12 intact ?Sensation all 4 extremities intact ?Psychiatric:  ?Mental status ?Mood, affect appropriate ?Orientation to person, place, time  ?judgment and insight appear intact ? ?Condition at discharge: fair ? ?The results of significant diagnostics from this hospitalization (including imaging, microbiology, ancillary and laboratory) are listed below for reference.  ? ?Imaging Studies: ?CT Head Wo Contrast ? ?Result Date: 07/27/2021 ?CLINICAL DATA:  Headache EXAM: CT HEAD WITHOUT CONTRAST TECHNIQUE: Contiguous axial images were obtained from the base of the skull through the vertex without intravenous contrast. RADIATION DOSE REDUCTION: This exam was performed according to the departmental dose-optimization program which includes automated exposure control, adjustment of the mA and/or kV according to patient size and/or  use of iterative reconstruction technique. COMPARISON:  None. FINDINGS: Brain: No acute intracranial hemorrhage, mass effect, or herniation. No extra-axial fluid collections. No evidence of acute territorial infarct. No hydrocephalus. Vascular: No hyperdense vessel or unexpected calcification. Skull: Normal. Negative for fracture or focal lesion. Sinuses/Orbits: No acute finding. Other: None. IMPRESSION: No acute intracranial process identified. Electronically Signed   By: Ofilia Neas M.D.   On: 07/27/2021 14:56  ? ?MR BRAIN W WO CONTRAST ? ?Result Date: 07/29/2021 ?CLINICAL DATA:  Initial evaluation for acute headache, meningitis. EXAM: MRI HEAD WITHOUT AND WITH CONTRAST TECHNIQUE: Multiplanar, multiecho pulse sequences of the brain and surrounding structures were obtained without and with intravenous contrast. CONTRAST:  70mL GADAVIST GADOBUTROL 1 MMOL/ML IV SOLN COMPARISON:  Prior head CT from 07/27/2021. FINDINGS: Brain: Cerebral volume within normal limits. No focal parenchymal signal abnormality. No signal changes to suggest acute cerebritis or encephalitis. No parenchymal changes about either temporal lobe to suggest acute herpes encephalitis. Following contrast administration, there is suspected subtle diffuse left a meningeal enhancement, most pronounced about the posterior cerebral hemispheres bilaterally (series 19, image 5). Finding could reflect changes of acute meningitis. Again, no associated parenchymal complications are seen.  No intraventricular debris or enhancement. No evidence for acute or subacute infarct. No mass lesion, midline shift or mass effect. No hydrocephalus or extra-axial fluid collection. Pituitary gland suprasellar region within normal limits. Midline structures intact and normal. No other abnormal enhancement. Vascular: Major intracranial vascular flow voids are well maintained. Skull and upper cervical spine: Craniocervical junction within normal limits. Bone marrow signal  intensity normal for age. No focal marrow replacing lesion. No scalp soft tissue abnormality. Sinuses/Orbits: Globes and orbital soft tissues demonstrate no acute finding. Paranasal sinuses are largely clear

## 2021-08-01 NOTE — TOC Transition Note (Signed)
Transition of Care (TOC) - CM/SW Discharge Note ? ?Patient Details  ?Name: Carrie Waters ?MRN: [id number] ?Date of Birth: 07-06-2000 ? ?Transition of Care (TOC) CM/SW Contact:  ?Sherie Don, LCSW ?Phone Number: ?08/01/2021, 12:40 PM ? ?Clinical Narrative: TOC consulted for medication assistance/PCP needs. Patient has Medicaid and was assigned a PCP through Medicaid. TOC cannot assist with medications for insured patients. CSW updated hospitalist and RN. TOC signing off. ? ?Final next level of care: Home/Self Care ?Barriers to Discharge: No Barriers Identified ? ?Patient Goals and CMS Choice ?Choice offered to / list presented to : NA ? ?Discharge Plan and Services         ?DME Arranged: N/A ?DME Agency: NA ? ?Readmission Risk Interventions ?   ? View : No data to display.  ?  ?  ?  ? ?

## 2021-08-01 NOTE — Plan of Care (Signed)
  Problem: Clinical Measurements: Goal: Ability to maintain clinical measurements within normal limits will improve Outcome: Progressing   Problem: Pain Managment: Goal: General experience of comfort will improve Outcome: Progressing   

## 2021-11-29 ENCOUNTER — Emergency Department (HOSPITAL_BASED_OUTPATIENT_CLINIC_OR_DEPARTMENT_OTHER)
Admission: EM | Admit: 2021-11-29 | Discharge: 2021-11-29 | Disposition: A | Discharge status: Home / Self Care | Payer: Medicaid Other | Attending: Emergency Medicine | Admitting: Emergency Medicine

## 2021-11-29 ENCOUNTER — Other Ambulatory Visit: Payer: Self-pay

## 2021-11-29 DIAGNOSIS — Z20822 Contact with and (suspected) exposure to covid-19: Secondary | ICD-10-CM | POA: Diagnosis not present

## 2021-11-29 DIAGNOSIS — A6004 Herpesviral vulvovaginitis: Secondary | ICD-10-CM | POA: Insufficient documentation

## 2021-11-29 DIAGNOSIS — R000 Tachycardia, unspecified: Secondary | ICD-10-CM | POA: Insufficient documentation

## 2021-11-29 DIAGNOSIS — R519 Headache, unspecified: Secondary | ICD-10-CM | POA: Insufficient documentation

## 2021-11-29 DIAGNOSIS — M546 Pain in thoracic spine: Secondary | ICD-10-CM | POA: Insufficient documentation

## 2021-11-29 DIAGNOSIS — M436 Torticollis: Secondary | ICD-10-CM | POA: Diagnosis present

## 2021-11-29 DIAGNOSIS — J020 Streptococcal pharyngitis: Secondary | ICD-10-CM | POA: Insufficient documentation

## 2021-11-29 LAB — BASIC METABOLIC PANEL WITH GFR
Anion gap: 8 (ref 5–15)
BUN: 11 mg/dL (ref 6–20)
CO2: 25 mmol/L (ref 22–32)
Calcium: 9.3 mg/dL (ref 8.9–10.3)
Chloride: 103 mmol/L (ref 98–111)
Creatinine, Ser: 0.77 mg/dL (ref 0.44–1.00)
GFR, Estimated: >60 mL/min (ref >60)
Glucose, Bld: 89 mg/dL (ref 70–99)
Potassium: 3.4 mmol/L — ABNORMAL LOW (ref 3.5–5.1)
Sodium: 136 mmol/L (ref 135–145)

## 2021-11-29 LAB — CBC WITH DIFFERENTIAL/PLATELET
Abs Immature Granulocytes: 0.05 10*3/uL (ref 0.00–0.07)
Basophils Absolute: 0 10*3/uL (ref 0.0–0.1)
Basophils Relative: 0 %
Eosinophils Absolute: 0.1 10*3/uL (ref 0.0–0.5)
Eosinophils Relative: 1 %
HCT: 37.9 % (ref 36.0–46.0)
Hemoglobin: 12.7 g/dL (ref 12.0–15.0)
Immature Granulocytes: 0 %
Lymphocytes Relative: 10 %
Lymphs Abs: 1.2 10*3/uL (ref 0.7–4.0)
MCH: 28 pg (ref 26.0–34.0)
MCHC: 33.5 g/dL (ref 30.0–36.0)
MCV: 83.5 fL (ref 80.0–100.0)
Monocytes Absolute: 1.2 10*3/uL — ABNORMAL HIGH (ref 0.1–1.0)
Monocytes Relative: 10 %
Neutro Abs: 9.5 10*3/uL — ABNORMAL HIGH (ref 1.7–7.7)
Neutrophils Relative %: 79 %
Platelets: 182 10*3/uL (ref 150–400)
RBC: 4.54 MIL/uL (ref 3.87–5.11)
RDW: 13.2 % (ref 11.5–15.5)
WBC: 12.1 10*3/uL — ABNORMAL HIGH (ref 4.0–10.5)
nRBC: 0 % (ref 0.0–0.2)

## 2021-11-29 LAB — RESP PANEL BY RT-PCR (FLU A&B, COVID) ARPGX2
Influenza A by PCR: NEGATIVE
Influenza B by PCR: NEGATIVE
SARS Coronavirus 2 by RT PCR: NEGATIVE

## 2021-11-29 LAB — LACTIC ACID, PLASMA: Lactic Acid, Venous: 0.8 mmol/L (ref 0.5–1.9)

## 2021-11-29 LAB — GROUP A STREP BY PCR: Group A Strep by PCR: DETECTED — AB

## 2021-11-29 MED ORDER — PENICILLIN G BENZATHINE 1200000 UNIT/2ML IM SUSY
1.2000 10*6.[IU] | PREFILLED_SYRINGE | Freq: Once | INTRAMUSCULAR | Status: AC
  Administered 2021-11-29: 1.2 10*6.[IU] via INTRAMUSCULAR
  Filled 2021-11-29: qty 2

## 2021-11-29 MED ORDER — SODIUM CHLORIDE 0.9 % IV SOLN
2.0000 g | Freq: Once | INTRAVENOUS | Status: AC
  Administered 2021-11-29: 2 g via INTRAVENOUS
  Filled 2021-11-29: qty 20

## 2021-11-29 MED ORDER — ACETAMINOPHEN 500 MG PO TABS: 500.0000 mg | ORAL_TABLET | Freq: Four times a day (QID) | ORAL | 0 refills | Status: DC | PRN

## 2021-11-29 MED ORDER — VALACYCLOVIR HCL 1 G PO TABS: 1000.0000 mg | ORAL_TABLET | Freq: Three times a day (TID) | ORAL | 0 refills | Status: DC

## 2021-11-29 MED ORDER — LIDOCAINE VISCOUS HCL 2 % MT SOLN: 15.0000 mL | OROMUCOSAL | 0 refills | Status: DC | PRN

## 2021-11-29 MED ORDER — VALACYCLOVIR HCL 500 MG PO TABS
1000.0000 mg | ORAL_TABLET | Freq: Once | ORAL | Status: AC
  Administered 2021-11-29: 1000 mg via ORAL
  Filled 2021-11-29: qty 2

## 2021-11-29 MED ORDER — LIDOCAINE HCL (PF) 1 % IJ SOLN
5.0000 mL | Freq: Once | INTRAMUSCULAR | Status: DC
  Filled 2021-11-29: qty 5

## 2021-11-29 MED ORDER — SODIUM CHLORIDE 0.9 % IV BOLUS
1000.0000 mL | Freq: Once | INTRAVENOUS | Status: AC
  Administered 2021-11-29: 1000 mL via INTRAVENOUS

## 2021-11-29 NOTE — ED Notes (Signed)
ED Provider at bedside. 

## 2021-11-29 NOTE — ED Triage Notes (Signed)
Pt arrived POV, caox4 and ambulatory. Pt c/o stiff neck, back pain, and headache. Pt reports back pain started last night and neck has been stiff with headache today. Pt reports hx of meningitis in April. Pt reports pain is 10/10.

## 2021-11-29 NOTE — ED Provider Notes (Signed)
[facility] EMERGENCY DEPT Provider Note   CSN: [id number] Arrival date & time: 11/29/21  1648     History  Chief Complaint  Patient presents with   Torticollis    Carrie Waters is a 21 y.o. female.  The history is provided by the patient and medical records. No language interpreter was used.     21 year old female significant history of meningitis in the past presenting complaining of headache.  Patient reports since yesterday she developed headache, neck stiffness, and upper back pain.  She also endorsed having some sore throat and swollen lymph nodes in her neck.  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  She also mention having been diagnosed with herpes infection previously and for the past 2 to 3 days she did notice a lesion in her genital area similar to her herpes outbreak.  She is not on any specific treatment.  She denies any light or sound sensitivity he denies any significant fever chills or body aches.  No focal numbness or focal weakness.  No specific treatment tried.  Denies change in her sleep wear or new pillows.  Home Medications Prior to Admission medications   Medication Sig Start Date End Date Taking? Authorizing Provider  acetaminophen (TYLENOL) 325 MG tablet Take 2 tablets (650 mg total) by mouth every 6 (six) hours as needed for mild pain (or Fever >/= 101). 08/01/21   Swayze, Ava, DO  albuterol (VENTOLIN HFA) 108 (90 Base) MCG/ACT inhaler Inhale 2 puffs into the lungs every 4 (four) hours as needed for wheezing or shortness of breath. Patient not taking: Reported on 07/28/2021 05/09/21   Rodriguez-Southworth, Nettie Elm, PA-C  Multiple Vitamin (MULTIVITAMIN WITH MINERALS) TABS tablet Take 1 tablet by mouth daily. 08/02/21   Swayze, Ava, DO      Allergies    Other and Sulfa antibiotics    Review of Systems   Review of Systems  All other systems reviewed and are  negative.   Physical Exam Updated Vital Signs BP 130/85 (BP Location: Right Arm)   Pulse (!) 105   Temp 99.2 F (37.3 C)   Resp 14   LMP 11/22/2021 (Approximate)   SpO2 100%  Physical Exam Vitals and nursing note reviewed.  Constitutional:      General: She is not in acute distress.    Appearance: She is well-developed.     Comments: Patient resting comfortably in bed appears to be in no acute discomfort.  She is nontoxic in appearance  HENT:     Head: Normocephalic and atraumatic.     Mouth/Throat:     Mouth: Mucous membranes are moist.     Comments: Uvula midline posterior oropharyngeal erythema noted with 1+ enlarged tonsils bilaterally.  No peritonsillar abscess Eyes:     Conjunctiva/sclera: Conjunctivae normal.  Cardiovascular:     Rate and Rhythm: Tachycardia present.     Pulses: Normal pulses.     Heart sounds: Normal heart sounds.  Pulmonary:     Effort: Pulmonary effort is normal.  Abdominal:     Palpations: Abdomen is soft.     Tenderness: There is no abdominal tenderness.  Genitourinary:    Comments: Chaperone present during exam.  Three vesicular lesions noted to left labia minora consistent with genital herpes. Musculoskeletal:     Cervical back: Rigidity present.  Skin:    Findings: No rash.  Neurological:     Mental Status: She is alert.  GCS: GCS eye subscore is 4. GCS verbal subscore is 5. GCS motor subscore is 6.     Cranial Nerves: Cranial nerves 2-12 are intact.     Sensory: Sensation is intact.     Motor: Motor function is intact.  Psychiatric:        Mood and Affect: Mood normal.     ED Results / Procedures / Treatments   Labs (all labs ordered are listed, but only abnormal results are displayed) Labs Reviewed  GROUP A STREP BY PCR - Abnormal; Notable for the following components:      Result Value   Group A Strep by PCR DETECTED (*)    All other components within normal limits  BASIC METABOLIC PANEL - Abnormal; Notable for the  following components:   Potassium 3.4 (*)    All other components within normal limits  CBC WITH DIFFERENTIAL/PLATELET - Abnormal; Notable for the following components:   WBC 12.1 (*)    Neutro Abs 9.5 (*)    Monocytes Absolute 1.2 (*)    All other components within normal limits  RESP PANEL BY RT-PCR (FLU A&B, COVID) ARPGX2  LACTIC ACID, PLASMA    EKG None  Radiology No results found.  Procedures Procedures    Medications Ordered in ED Medications  penicillin g benzathine (BICILLIN LA) 1200000 UNIT/2ML injection 1.2 Million Units (has no administration in time range)  sodium chloride 0.9 % bolus 1,000 mL (1,000 mLs Intravenous New Bag/Given 11/29/21 2011)  cefTRIAXone (ROCEPHIN) 2 g in sodium chloride 0.9 % 100 mL IVPB (2 g Intravenous New Bag/Given 11/29/21 2011)  valACYclovir (VALTREX) tablet 1,000 mg (1,000 mg Oral Given 11/29/21 2002)    ED Course/ Medical Decision Making/ A&P                           Medical Decision Making Amount and/or Complexity of Data Reviewed Labs: ordered.  Risk OTC drugs. Prescription drug management.   BP 130/85 (BP Location: Right Arm)   Pulse (!) 105   Temp 99.2 F (37.3 C)   Resp 14   LMP 11/22/2021 (Approximate)   SpO2 100%   7:53 PM This is a 21 year old female presenting complaint of headache and neck stiffness for the past 2 days.  Furthermore she also report having herpes breakout in her genital area for the past 2 to 3 days.  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  She also endorsed having sore throat and enlarged lymph node in her neck.  On exam patient is well-appearing appears to be in no acute discomfort.  She does have nuchal rigidity.  Throat exam reveals mild posterior oropharyngeal erythema but no evidence of deep tissue infection.  She has normal phonation.  She does have some enlarged lymph nodes to her anterior cervical chain.  She  is mildly tachycardic but lungs are clear abdomen is soft nontender.  Given her presentation and history of viral meningitis along with herpes outbreak, I am concerning for potential meningitis contributing to her symptoms.  Her oral temp is 99.2 when she is tachycardic with a heart rate of 105.  Blood pressure is 130/85 and no hypoxia.  We will give IV fluid, labs ordered, will perform lumbar puncture.  We will preemptively treat patient with Rocephin 2 g as well as valacyclovir 1000 mg.  9:13 PM Initial plan was to perform lumbar  puncture to assess for potential meningitis however patient test came back positive for group A strep.  COVID and flu test came back negative, normal lactic acid, white count mildly elevated at 12.1, electrolyte panels are reassuring.  Strep infection likely contributing to her ongoing symptoms.  I have low suspicion for meningitis given this finding.  After discussion, we felt lumbar puncture will be more risky than benefits and will hold off on LP.  Patient did receive Rocephin and valacyclovir here.  Chaperone present during exam patient does have multiple herpetic lesion noted to her left labia minora consistent with genital herpes.  We will treat with valacyclovir.  Strict return precaution discussed and patient voiced understanding and agrees with plan.  I felt patient is stable to be discharged home.  This patient presents to the ED for concern of neck pain, this involves an extensive number of treatment options, and is a complaint that carries with it a high risk of complications and morbidity.  The differential diagnosis includes strep pharyngitis, meningitis, torticollis, retropharyngeal abscess, PTA, lemierre's disease, Ludwig's angina  Co morbidities that complicate the patient evaluation meningitis Additional history obtained:  Additional history obtained from friend and mother External records from outside source obtained and reviewed including EMR including  prior labs and imaging  Lab Tests:  I Ordered, and personally interpreted labs.  The pertinent results include:  as above   Cardiac Monitoring:  The patient was maintained on a cardiac monitor.  I personally viewed and interpreted the cardiac monitored which showed an underlying rhythm of: NSR  Medicines ordered and prescription drug management:  I ordered medication including pen VK  for strep pharyngitis Reevaluation of the patient after these medicines showed that the patient improved I have reviewed the patients home medicines and have made adjustments as needed  Test Considered: LP  Critical Interventions: IV abx  Consultations Obtained:  I requested consultation with the attending Dr. Dayna Ramus,  and discussed lab and imaging findings as well as pertinent plan - they recommend: treat pharyngitis with abx  Problem List / ED Course: sore throat  Pharyngitis  Genital herpes  Reevaluation:  After the interventions noted above, I reevaluated the patient and found that they have :improved  Social Determinants of Health: none  Dispostion:  After consideration of the diagnostic results and the patients response to treatment, I feel that the patent would benefit from outpt f/u.         Final Clinical Impression(s) / ED Diagnoses Final diagnoses:  Strep pharyngitis  Herpes simplex vulvovaginitis    Rx / DC Orders ED Discharge Orders          Ordered    valACYclovir (VALTREX) 1000 MG tablet  3 times daily        11/29/21 2128    lidocaine (XYLOCAINE) 2 % solution  As needed        11/29/21 2128    acetaminophen (TYLENOL) 500 MG tablet  Every 6 hours PRN        11/29/21 2128              Fayrene Helper, PA-C 11/29/21 2154    Dayna Ramus, Cecile Sheerer, DO 11/30/21 0022

## 2021-11-29 NOTE — ED Notes (Signed)
Pt verbalizes understanding of discharge instructions. Opportunity for questioning and answers were provided. Pt discharged from ED to home with family.    

## 2021-11-29 NOTE — Discharge Instructions (Addendum)
You have been evaluated for your symptoms.  Your neck pain and sore throat is likely due to strep pharyngitis in which antibiotic was given in the ER.  You may use viscous lidocaine as needed for throat discomfort.  Take Tylenol for fever and body aches.  He also has been diagnosed with genital herpes.  Take valacyclovir 3 times daily for the next week as treatment.  If you develop severe headache, ongoing fever and your symptoms not improve do not hesitate to return to ER for further management which may include a lumbar puncture.

## 2021-12-01 DIAGNOSIS — B009 Herpesviral infection, unspecified: Secondary | ICD-10-CM | POA: Insufficient documentation

## 2022-05-28 ENCOUNTER — Ambulatory Visit (HOSPITAL_COMMUNITY)
Admission: RE | Admit: 2022-05-28 | Discharge: 2022-05-28 | Disposition: A | Discharge status: Home / Self Care | Payer: Medicaid Other | Source: Ambulatory Visit | Attending: Emergency Medicine | Admitting: Emergency Medicine

## 2022-05-28 ENCOUNTER — Encounter (HOSPITAL_COMMUNITY): Payer: Self-pay

## 2022-05-28 VITALS — BP 109/72 | HR 98 | Temp 98.1°F | Resp 14

## 2022-05-28 DIAGNOSIS — Z202 Contact with and (suspected) exposure to infections with a predominantly sexual mode of transmission: Secondary | ICD-10-CM | POA: Diagnosis not present

## 2022-05-28 DIAGNOSIS — Z3202 Encounter for pregnancy test, result negative: Secondary | ICD-10-CM | POA: Diagnosis present

## 2022-05-28 DIAGNOSIS — Z113 Encounter for screening for infections with a predominantly sexual mode of transmission: Secondary | ICD-10-CM | POA: Diagnosis present

## 2022-05-28 LAB — POC URINE PREG, ED: Preg Test, Ur: NEGATIVE

## 2022-05-28 LAB — HIV ANTIBODY (ROUTINE TESTING W REFLEX): HIV Screen 4th Generation wRfx: NONREACTIVE

## 2022-05-28 NOTE — Discharge Instructions (Signed)
We are checking for HIV, syphilis, trichomonas, BV, yeast, gonorrhea and chlamydia today.  Give [place] a working phone number so that we can contact you if needed. Refrain from sexual contact until all of your labs have come back, symptoms have resolved, and your partner(s) are treated if necessary. Return to the ER if you get worse, have a fever >100.4, or for any concerns.   Go to www.goodrx.com  or www.costplusdrugs.com to look up your medications. This will give you a list of where you can find your prescriptions at the most affordable prices. Or ask the pharmacist what the cash price is, or if they have any other discount programs available to help make your medication more affordable. This can be less expensive than what you would pay with insurance.

## 2022-05-28 NOTE — ED Triage Notes (Signed)
Pt requesting STD testing  denies exposure or s/s

## 2022-05-28 NOTE — ED Provider Notes (Signed)
HPI  SUBJECTIVE:  Carrie Waters is a 22 y.o. female who presents for STD testing.  She reports vaginal odor for the past month, but is currently having light vaginal spotting consistent with menses.  No vaginal discharge, genital rash, itching, urinary complaints, nausea,, fevers, abdominal, back, pelvic pain.  She is sexually active with a female, who is asymptomatic.  She does not have any other partners, but is not sure about him.  She has tried nonscented soap for the vaginal odor without improvement in her symptoms.  No aggravating or alleviating factors.  She has a past medical history of trichomonas, HSV, BV and yeast.  No history of STDs, diabetes.  LMP: Now.  She is irregular.  She has a Nexplanon.  She is unsure if she could be pregnant and would like for [place] to check.  PCP: [facility].   History reviewed. No pertinent past medical history.  History reviewed. No pertinent surgical history.  Family History  Problem Relation Age of Onset   Healthy Mother    Healthy Father     Social History   Tobacco Use   Smoking status: Never   Smokeless tobacco: Never  Vaping Use   Vaping Use: Never used  Substance Use Topics   Alcohol use: Yes   Drug use: Never    No current facility-administered medications for this encounter.  Current Outpatient Medications:    acetaminophen (TYLENOL) 500 MG tablet, Take 1 tablet (500 mg total) by mouth every 6 (six) hours as needed., Disp: 30 tablet, Rfl: 0   Multiple Vitamin (MULTIVITAMIN WITH MINERALS) TABS tablet, Take 1 tablet by mouth daily., Disp: 30 tablet, Rfl: 0   valACYclovir (VALTREX) 1000 MG tablet, Take 1 tablet (1,000 mg total) by mouth 3 (three) times daily., Disp: 21 tablet, Rfl: 0  Allergies  Allergen Reactions   Other Diarrhea    blueberries   Sulfa Antibiotics Hives and Rash     ROS  As noted in HPI.   Physical Exam  BP 109/72 (BP Location: Left Arm)   Pulse 98   Temp 98.1 F (36.7 C) (Oral)   Resp 14    SpO2 99%   Constitutional: Well developed, well nourished, no acute distress Eyes:  EOMI, conjunctiva normal bilaterally HENT: Normocephalic, atraumatic,mucus membranes moist Respiratory: Normal inspiratory effort Cardiovascular: Normal rate GI: nondistended soft, nontender. No suprapubic, flank tenderness  back: No CVA tenderness GU: Deferred skin: No rash, skin intact Musculoskeletal: no deformities Neurologic: Alert & oriented x 3, no focal neuro deficits Psychiatric: Speech and behavior appropriate   ED Course   Medications - No data to display  Orders Placed This Encounter  Procedures   RPR    Standing Status:   Standing    Number of Occurrences:   1   HIV Antibody (routine testing w rflx)    Standing Status:   Standing    Number of Occurrences:   1   POC urine pregnancy    Standing Status:   Standing    Number of Occurrences:   1    Results for orders placed or performed during the hospital encounter of 05/28/22 (from the past 24 hour(s))  POC urine pregnancy     Status: None   Collection Time: 05/28/22  5:52 PM  Result Value Ref Range   Preg Test, Ur Negative Negative   No results found.  ED Clinical Impression  1. Screening for STDs (sexually transmitted diseases)   2. Negative pregnancy test  ED Assessment/Plan    Checking urine pregnancy.  Sent off vaginal swab, HIV, RPR. Will not treat empirically now- will base treatment off of labs. Advised pt to refrain from sexual contact until she knows lab results, symptoms resolve, and partner(s) are treated if necessary. Pt provided working phone number. Follow-up with PCP as needed. Discussed labs, MDM, plan and followup with patient. Pt agrees with plan.   Urine pregnancy negative.  Plan as above.  No orders of the defined types were placed in this encounter.   *This clinic note was created using Dragon dictation software. Therefore, there may be occasional mistakes despite careful  proofreading.  ?     Melynda Ripple, MD 05/28/22 [phone]

## 2022-05-29 LAB — CERVICOVAGINAL ANCILLARY ONLY
Bacterial Vaginitis (gardnerella): NEGATIVE
Candida Glabrata: NEGATIVE
Candida Vaginitis: NEGATIVE
Chlamydia: NEGATIVE
Comment: NEGATIVE
Comment: NEGATIVE
Comment: NEGATIVE
Comment: NEGATIVE
Comment: NEGATIVE
Comment: NORMAL
Neisseria Gonorrhea: NEGATIVE
Trichomonas: NEGATIVE

## 2022-05-29 LAB — SYPHILIS: RPR W/REFLEX TO RPR TITER AND TREPONEMAL ANTIBODIES, TRADITIONAL SCREENING AND DIAGNOSIS ALGORITHM: RPR Ser Ql: NONREACTIVE

## 2022-10-25 IMAGING — MR MR HEAD WO/W CM
16 series · 48 of 48 positions shown · IV contrast (6ml GADAVIST)
Comparison: Prior head CT from 07/27/2021.

CLINICAL DATA: Initial evaluation for acute headache, meningitis.

EXAM:
MRI HEAD WITHOUT AND WITH CONTRAST
TECHNIQUE: Multiplanar, multiecho pulse sequences of the brain and surrounding
structures were obtained without and with intravenous contrast.
CONTRAST:  6mL GADAVIST GADOBUTROL 1 MMOL/ML IV SOLN

[Series 5: DWI · axial · 3.0mm · 1.36mm/px · z∈[-75,+63]mm · 6 of 96 slices shown (1 of 2)]
[im 1/96]
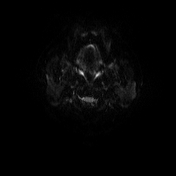
[im 20/96]
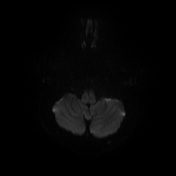
[im 39/96]
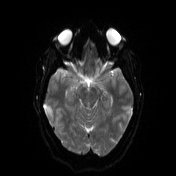
[im 58/96]
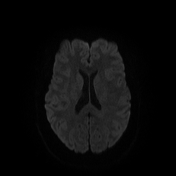
[im 77/96]
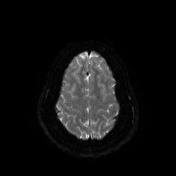
[im 96/96]
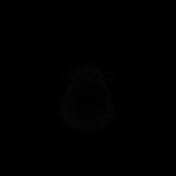

[Series 6: DWI · axial · 3.0mm · 1.36mm/px · z∈[-75,+63]mm · 4 of 48 slices shown (2 of 2)]
[im 1/48]
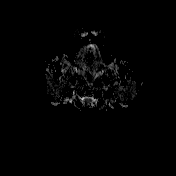
[im 16/48]
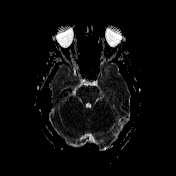
[im 32/48]
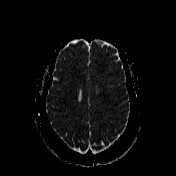
[im 48/48]
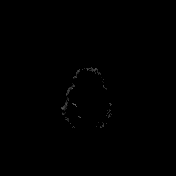

[Series 7: T1 · sagittal · 5.0mm · 0.75mm/px · 1 of 24 slices shown (1 of 4)]
[im 1/24]
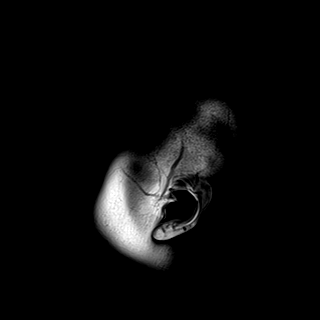

[Series 8: T2 · axial · 5.0mm · 0.62mm/px · 1 of 26 slices shown]
[im 1/26]
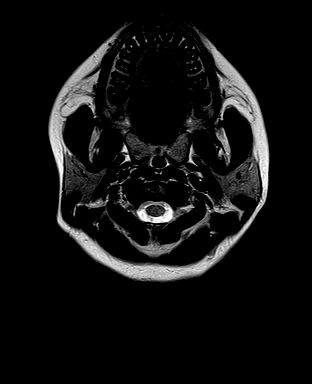

[Series 9: swi_images · axial · 3.0mm · 0.75mm/px · z∈[-76,+61]mm · 3 of 48 slices shown]
[im 1/48]
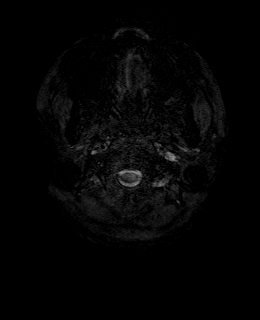
[im 24/48]
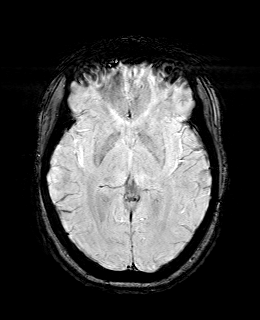
[im 48/48]
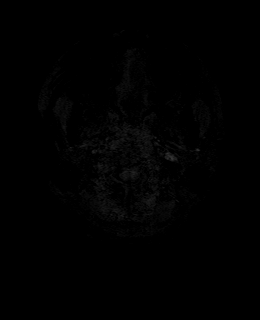

[Series 10: mip_images(sw) · axial · 24.0mm · 0.75mm/px · z∈[-66,+51]mm · 2 of 41 slices shown]
[im 1/41]
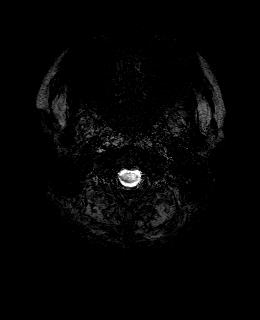
[im 41/41]
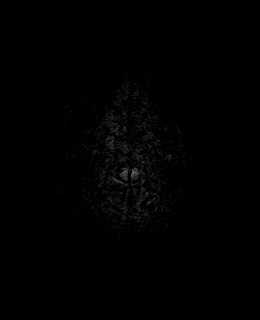

[Series 11: FLAIR · axial · 3.0mm · 0.75mm/px · z∈[-82,+67]mm · 3 of 52 slices shown]
[im 1/52]
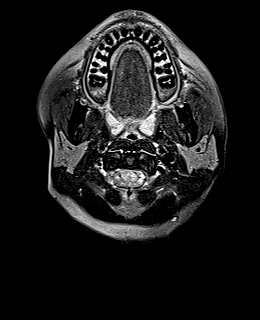
[im 26/52]
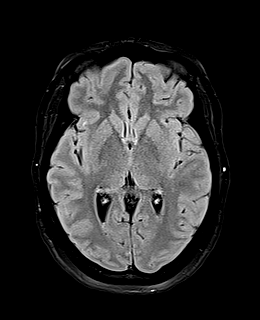
[im 52/52]
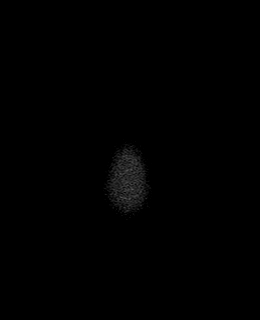

[Series 12: T1 · axial · 1.0mm · 0.94mm/px · z∈[-80,+59]mm · 8 of 144 slices shown (2 of 4)]
[im 1/144]
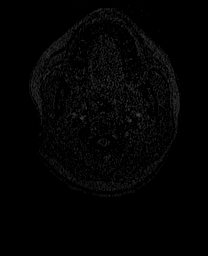
[im 21/144]
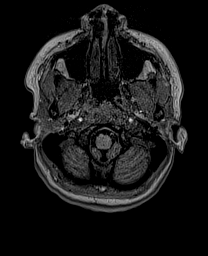
[im 41/144]
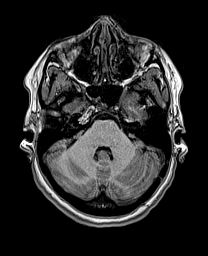
[im 62/144]
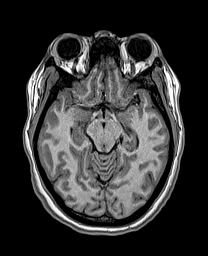
[im 82/144]
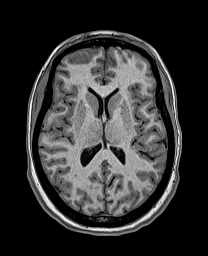
[im 103/144]
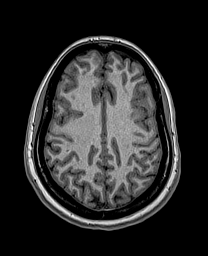
[im 123/144]
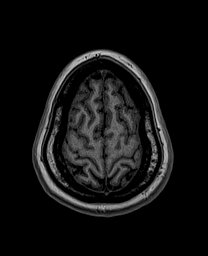
[im 144/144]
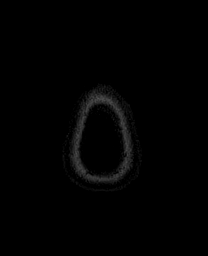

[Series 13: cor dwi_tracew · coronal · 5.0mm · 1.53mm/px · 3 of 52 slices shown]
[im 1/52]
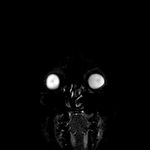
[im 26/52]
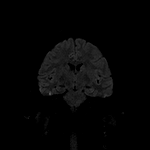
[im 52/52]
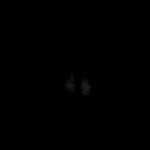

[Series 14: cor dwi_adc · coronal · 5.0mm · 1.53mm/px · 1 of 26 slices shown]
[im 1/26]
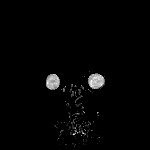

[Series 15: T2 post-contrast · coronal · 5.0mm · 0.57mm/px · 2 of 32 slices shown]
[im 1/32]
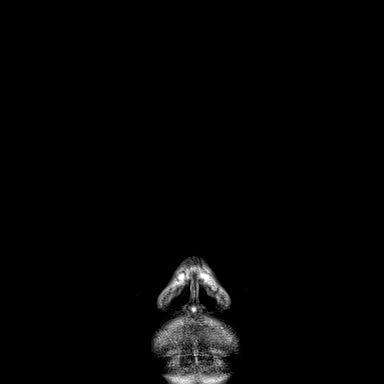
[im 32/32]
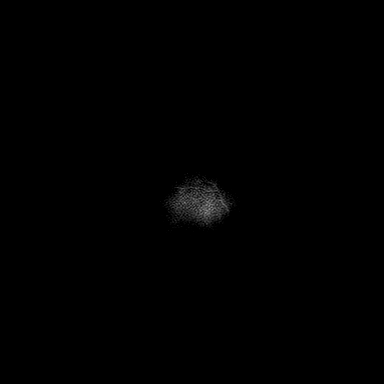

[Series 16: T1 post-contrast · axial · 1.0mm · 0.94mm/px · z∈[-80,+59]mm · 8 of 144 slices shown (1 of 3)]
[im 1/144]
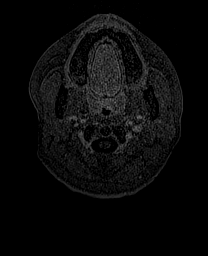
[im 21/144]
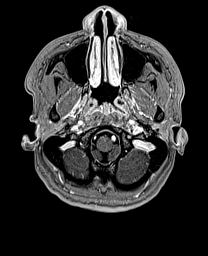
[im 41/144]
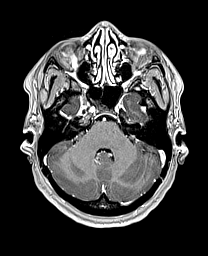
[im 62/144]
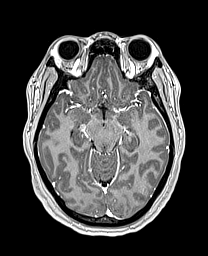
[im 82/144]
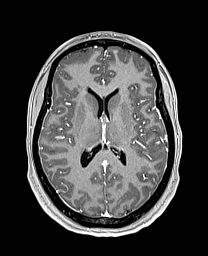
[im 103/144]
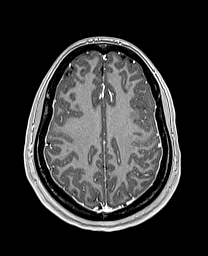
[im 123/144]
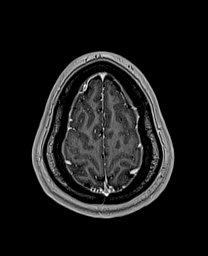
[im 144/144]
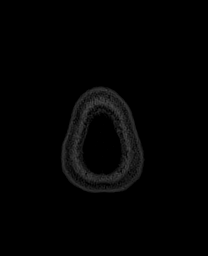

[Series 17: T1 · sagittal · 4.0mm · 0.94mm/px · 2 of 30 slices shown (3 of 4)]
[im 1/30]
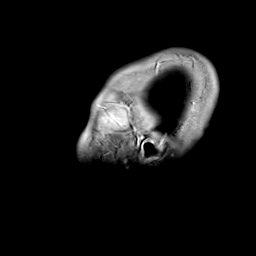
[im 30/30]
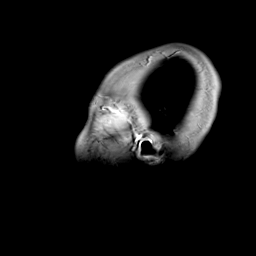

[Series 18: T1 · coronal · 4.0mm · 0.94mm/px · 2 of 30 slices shown (4 of 4)]
[im 1/30]
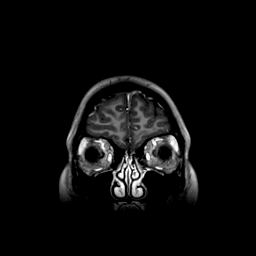
[im 30/30]
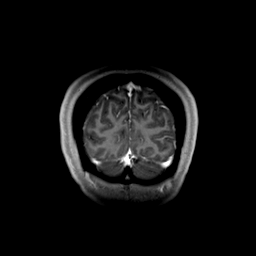

[Series 19: T1 post-contrast · coronal · 5.0mm · 0.43mm/px · 1 of 25 slices shown (2 of 3)]
[im 1/25]
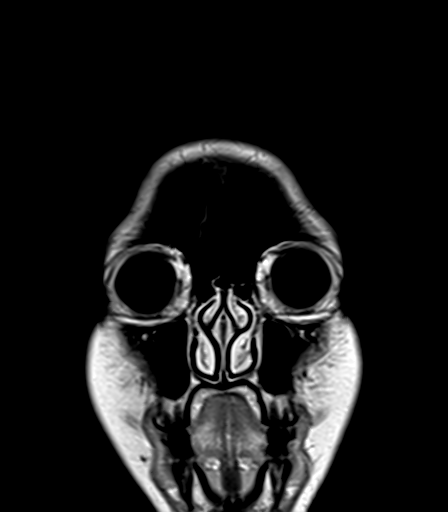

[Series 20: T1 post-contrast · sagittal · 5.0mm · 0.75mm/px · 1 of 24 slices shown (3 of 3)]
[im 1/24]
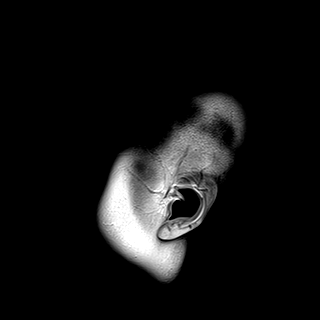

[48 of 48 positions shown; findings below may reference images not displayed]

FINDINGS: Brain: Cerebral volume within normal limits. No focal parenchymal
signal abnormality. No signal changes to suggest acute cerebritis or
encephalitis. No parenchymal changes about either temporal lobe to
suggest acute herpes encephalitis. Following contrast
administration, there is suspected subtle diffuse left a meningeal
enhancement, most pronounced about the posterior cerebral
hemispheres bilaterally (series 19, image 5). Finding could reflect
changes of acute meningitis. Again, no associated parenchymal
complications are seen. No intraventricular debris or enhancement.

No evidence for acute or subacute infarct. No mass lesion, midline
shift or mass effect. No hydrocephalus or extra-axial fluid
collection. Pituitary gland suprasellar region within normal limits.
Midline structures intact and normal. No other abnormal enhancement.

Vascular: Major intracranial vascular flow voids are well
maintained.

Skull and upper cervical spine: Craniocervical junction within
normal limits. Bone marrow signal intensity normal for age. No focal
marrow replacing lesion. No scalp soft tissue abnormality.

Sinuses/Orbits: Globes and orbital soft tissues demonstrate no acute
finding. Paranasal sinuses are largely clear. Trace fluid signal
intensity noted within the mastoid air cells bilaterally, of
doubtful significance. Inner ear structures grossly normal.

Other: None.
IMPRESSION: 1. Suspected subtle diffuse leptomeningeal enhancement, most
pronounced about the posterior cerebral hemispheres bilaterally.
Given patient history, finding suspicious for possible changes of
acute meningitis. No associated encephalitis/cerebritis or other
complicating features. No MRI evidence for acute herpes
encephalitis.
2. Otherwise normal brain MRI. No other acute intracranial
abnormality identified.

## 2023-07-07 ENCOUNTER — Ambulatory Visit
Admission: EM | Admit: 2023-07-07 | Discharge: 2023-07-07 | Disposition: A | Discharge status: Home / Self Care | Attending: Emergency Medicine | Admitting: Emergency Medicine

## 2023-07-07 DIAGNOSIS — M546 Pain in thoracic spine: Secondary | ICD-10-CM | POA: Insufficient documentation

## 2023-07-07 DIAGNOSIS — M542 Cervicalgia: Secondary | ICD-10-CM | POA: Insufficient documentation

## 2023-07-07 LAB — RESP PANEL BY RT-PCR (FLU A&B, COVID) ARPGX2
Influenza A by PCR: NEGATIVE
Influenza B by PCR: NEGATIVE
SARS Coronavirus 2 by RT PCR: NEGATIVE

## 2023-07-07 LAB — GROUP A STREP BY PCR: Group A Strep by PCR: NOT DETECTED

## 2023-07-07 NOTE — Discharge Instructions (Addendum)
 As we discussed, you are negative for COVID, influenza, and strep.  We are unable to rule out meningitis here at urgent care as that requires a lumbar puncture.  You do have tension in your neck muscles that this may be musculoskeletal in nature.  You may use over-the-counter Tylenol and/or ibuprofen according the package instructions as needed for pain.  You may also apply moist heat to your neck and back for 20 minutes at a time, 2-3 times a day, to see if this helps improve your symptoms.  As we discussed, we are unable to rule out meningitis here in the urgent care as that requires a lumbar puncture.  You have stated that you want to wait until tomorrow to see if things improve.  If your symptoms have not improved I recommend she go to the emergency department for lumbar puncture to rule out meningitis.

## 2023-07-07 NOTE — ED Triage Notes (Signed)
 Patient present to UC for neck and back pain x 2 days ago. States she is unable to flex head, states this has happened before when she had strep and flu. Stuffy nose x 2 weeks. Hx of meningitis. Treating pain with naproxen and tylenol. No OTC pain meds today.

## 2023-07-07 NOTE — ED Provider Notes (Signed)
 [facility]    CSN: [id number] Arrival date & time: 07/07/23  1445      History   Chief Complaint Chief Complaint  Patient presents with   Back Pain    HPI Carrie Waters is a 23 y.o. female.   HPI  23 year old female with past medical history significant for servers, headache, and meningitis presents for evaluation of head and neck pain that started 2 days ago.  She reports that she is unable to flex her head.  She reports that in the past she has had this with both strep and influenza.  Also with meningitis.  She has been dealing with nasal congestion off and on for the last 2 weeks with the change of weather.  She denies any fever, sore throat, or cough.  She also denies any changes in vision, light sensitivity, nausea, or vomiting.  History reviewed. No pertinent past medical history.  Patient Active Problem List   Diagnosis Date Noted   Meningitis 07/27/2021   Headache 07/27/2021   SIRS (systemic inflammatory response syndrome) (HCC) 07/27/2021    History reviewed. No pertinent surgical history.  OB History   No obstetric history on file.      Home Medications    Prior to Admission medications   Medication Sig Start Date End Date Taking? Authorizing Provider  acetaminophen (TYLENOL) 500 MG tablet Take 1 tablet (500 mg total) by mouth every 6 (six) hours as needed. 11/29/21   Fayrene Helper, PA-C  Multiple Vitamin (MULTIVITAMIN WITH MINERALS) TABS tablet Take 1 tablet by mouth daily. 08/02/21   Swayze, Ava, DO  valACYclovir (VALTREX) 1000 MG tablet Take 1 tablet (1,000 mg total) by mouth 3 (three) times daily. 11/29/21   Fayrene Helper, PA-C    Family History Family History  Problem Relation Age of Onset   Healthy Mother    Healthy Father     Social History Social History   Tobacco Use   Smoking status: Never   Smokeless tobacco: Never  Vaping Use   Vaping status: Never Used  Substance Use Topics   Alcohol use: Yes   Drug use: Never      Allergies   Other and Sulfa antibiotics   Review of Systems Review of Systems  Constitutional:  Negative for fever.  HENT:  Positive for congestion and rhinorrhea. Negative for ear pain and sore throat.   Eyes:  Negative for visual disturbance.  Respiratory:  Negative for cough.   Gastrointestinal:  Negative for nausea and vomiting.  Musculoskeletal:  Positive for back pain and neck pain. Negative for neck stiffness.  Neurological:  Positive for headaches.     Physical Exam Triage Vital Signs ED Triage Vitals  Encounter Vitals Group     BP      Systolic BP Percentile      Diastolic BP Percentile      Pulse      Resp      Temp      Temp src      SpO2      Weight      Height      Head Circumference      Peak Flow      Pain Score      Pain Loc      Pain Education      Exclude from Growth Chart    No data found.  Updated Vital Signs BP 111/78 (BP Location: Left Arm)   Pulse (!) 108   Temp 98.6 F (37  C) (Oral)   Resp 18   SpO2 98%   Visual Acuity Right Eye Distance:   Left Eye Distance:   Bilateral Distance:    Right Eye Near:   Left Eye Near:    Bilateral Near:     Physical Exam Vitals and nursing note reviewed.  Constitutional:      Appearance: Normal appearance. She is not ill-appearing.  HENT:     Head: Normocephalic and atraumatic.     Nose: Nose normal.     Mouth/Throat:     Mouth: Mucous membranes are moist.     Pharynx: Oropharynx is clear. No oropharyngeal exudate or posterior oropharyngeal erythema.  Eyes:     General: No scleral icterus.    Extraocular Movements: Extraocular movements intact.     Conjunctiva/sclera: Conjunctivae normal.     Pupils: Pupils are equal, round, and reactive to light.     Comments: Mild light sensitivity to direct light.  Cardiovascular:     Rate and Rhythm: Normal rate and regular rhythm.     Pulses: Normal pulses.     Heart sounds: Normal heart sounds. No murmur heard.    No friction rub. No  gallop.  Pulmonary:     Effort: Pulmonary effort is normal.     Breath sounds: Normal breath sounds. No wheezing, rhonchi or rales.  Musculoskeletal:     Cervical back: Normal range of motion and neck supple. No rigidity or tenderness.  Lymphadenopathy:     Cervical: No cervical adenopathy.  Skin:    General: Skin is warm and dry.     Capillary Refill: Capillary refill takes less than 2 seconds.     Findings: No rash.  Neurological:     General: No focal deficit present.     Mental Status: She is alert and oriented to person, place, and time.      UC Treatments / Results  Labs (all labs ordered are listed, but only abnormal results are displayed) Labs Reviewed  GROUP A STREP BY PCR  RESP PANEL BY RT-PCR (FLU A&B, COVID) [id number]    EKG   Radiology No results found.  Procedures Procedures (including critical care time)  Medications Ordered in UC Medications - No data to display  Initial Impression / Assessment and Plan / UC Course  I have reviewed the triage vital signs and the nursing notes.  Pertinent labs & imaging results that were available during my care of the patient were reviewed by me and considered in my medical decision making (see chart for details).   Patient is a nontoxic-appearing 23 year old female presenting for evaluation of headache, neck pain, and back pain as outlined HPI above.  She reports that she has had similar symptoms with flu and strep though she denies any sore throat.  Her oropharyngeal exam is benign.  She has no cervical lymphadenopathy present on exam.  Cardiopulmonary exam assessment is to heart sounds with regular rate and rhythm lung sounds are clear auscultation all fields.  Patient does have tension in her bilateral trapezius muscles but she denies any tenderness to palpation.  She has no impediments to lateral rotation or extension of her neck but she does complain of pain with flexion.  I have a low index of suspicion for meningitis,  though it is on the differential.  As his flu, strep, and musculoskeletal neck and back pain.  I advised the patient that I cannot rule her out for meningitis here in the urgent care as we are unable to  perform a lumbar puncture.  She was requesting to be tested for strep and influenza.  I will order a strep and influenza PCR.  Respiratory panel is negative for COVID and influenza.  Strep PCR is negative.  I suspect that the patient's neck and back pain are musculoskeletal in nature though she denies any pain with palpation of her muscle rubs.  There is the outside chance of possible meningitis which require lumbar puncture.  I have explained to the patient she needs to go to the ER for an LP to rule out meningitis as we are unable to perform that at urgent care.  The patient reports that she would prefer to wait until tomorrow and if her symptoms worsen she will go to the ER at [facility] where she works.   Final Clinical Impressions(s) / UC Diagnoses   Final diagnoses:  Neck pain  Bilateral thoracic back pain, unspecified chronicity     Discharge Instructions      As we discussed, you are negative for COVID, influenza, and strep.  We are unable to rule out meningitis here at urgent care as that requires a lumbar puncture.  You do have tension in your neck muscles that this may be musculoskeletal in nature.  You may use over-the-counter Tylenol and/or ibuprofen according the package instructions as needed for pain.  You may also apply moist heat to your neck and back for 20 minutes at a time, 2-3 times a day, to see if this helps improve your symptoms.  As we discussed, we are unable to rule out meningitis here in the urgent care as that requires a lumbar puncture.  You have stated that you want to wait until tomorrow to see if things improve.  If your symptoms have not improved I recommend she go to the emergency department for lumbar puncture to rule out  meningitis.     ED Prescriptions   None    PDMP not reviewed this encounter.   Becky Augusta, NP 07/07/23 [phone]

## 2023-11-13 ENCOUNTER — Other Ambulatory Visit: Payer: Self-pay

## 2023-11-13 ENCOUNTER — Ambulatory Visit (HOSPITAL_COMMUNITY)
Admission: RE | Admit: 2023-11-13 | Discharge: 2023-11-13 | Disposition: A | Discharge status: Home / Self Care | Source: Ambulatory Visit | Attending: Internal Medicine | Admitting: Internal Medicine

## 2023-11-13 ENCOUNTER — Encounter (HOSPITAL_COMMUNITY): Payer: Self-pay

## 2023-11-13 VITALS — BP 126/81 | HR 69 | Temp 98.2°F | Resp 16

## 2023-11-13 DIAGNOSIS — Z113 Encounter for screening for infections with a predominantly sexual mode of transmission: Secondary | ICD-10-CM | POA: Diagnosis not present

## 2023-11-13 DIAGNOSIS — N760 Acute vaginitis: Secondary | ICD-10-CM | POA: Insufficient documentation

## 2023-11-13 DIAGNOSIS — J029 Acute pharyngitis, unspecified: Secondary | ICD-10-CM | POA: Diagnosis not present

## 2023-11-13 LAB — POCT RAPID STREP A (OFFICE): Rapid Strep A Screen: NEGATIVE

## 2023-11-13 MED ORDER — FLUCONAZOLE 150 MG PO TABS: 150.0000 mg | ORAL_TABLET | ORAL | 0 refills | Status: DC

## 2023-11-13 NOTE — ED Triage Notes (Signed)
 Pt c/o sore throat, vaginal itchiness and discharge since yesterday. Pt is requesting to be check for STD. Pt denies taking any medication for pain.

## 2023-11-13 NOTE — ED Provider Notes (Signed)
 [facility]    CSN: [id number] Arrival date & time: 11/13/23  1725      History   Chief Complaint Chief Complaint  Patient presents with   Vaginal Discharge    Possible yeast infection. Also want routine std testing - Entered by patient   Sore Throat    HPI Carrie Waters is a 23 y.o. female.   Patient presents to urgent care for evaluation of vaginal discharge that started yesterday.  Vaginal discharge is thick, white, and associated with vaginal itching.  Denies vaginal odor.  She would additionally like STD testing today.  She is sexually active with 1 female partner without known exposures to STDs.  Denies abdominal pain, nausea, vomiting, fever, chills, urinary symptoms, and chance of pregnancy.  States this feels similar to when she had a vaginal yeast infection.  She has not attempted use of any over-the-counter medications to help with symptoms PTA.  Additionally reports sore throat that started 2 to 3 days ago.  No recent sick contacts with similar symptoms.  States sore throat hurts when swallowing on the right side, denies fever, chills, abdominal pain, N/V/D, rash, and recent sick contacts with similar symptoms.  No recent antibiotic or steroid use reported.  She is not coughing and denies shortness of breath/chest tightness.    Vaginal Discharge Sore Throat    History reviewed. No pertinent past medical history.  Patient Active Problem List   Diagnosis Date Noted   Meningitis 07/27/2021   Headache 07/27/2021   SIRS (systemic inflammatory response syndrome) (HCC) 07/27/2021    History reviewed. No pertinent surgical history.  OB History   No obstetric history on file.      Home Medications    Prior to Admission medications   Medication Sig Start Date End Date Taking? Authorizing Provider  fluconazole  (DIFLUCAN ) 150 MG tablet Take 1 tablet (150 mg total) by mouth every 3 (three) days. 11/13/23  Yes Enedelia Dorna HERO, FNP  acetaminophen   (TYLENOL ) 500 MG tablet Take 1 tablet (500 mg total) by mouth every 6 (six) hours as needed. 11/29/21   Nivia Colon, PA-C  Multiple Vitamin (MULTIVITAMIN WITH MINERALS) TABS tablet Take 1 tablet by mouth daily. 08/02/21   Swayze, Ava, DO  valACYclovir  (VALTREX ) 1000 MG tablet Take 1 tablet (1,000 mg total) by mouth 3 (three) times daily. 11/29/21   Nivia Colon, PA-C    Family History Family History  Problem Relation Age of Onset   Healthy Mother    Healthy Father     Social History Social History   Tobacco Use   Smoking status: Never   Smokeless tobacco: Never  Vaping Use   Vaping status: Never Used  Substance Use Topics   Alcohol use: Yes   Drug use: Never     Allergies   Other and Sulfa antibiotics   Review of Systems Review of Systems  Genitourinary:  Positive for vaginal discharge.  Per HPI   Physical Exam Triage Vital Signs ED Triage Vitals [11/13/23 1739]  Encounter Vitals Group     BP 126/81     Girls Systolic BP Percentile      Girls Diastolic BP Percentile      Boys Systolic BP Percentile      Boys Diastolic BP Percentile      Pulse Rate 69     Resp 16     Temp 98.2 F (36.8 C)     Temp Source Oral     SpO2 98 %  Weight      Height      Head Circumference      Peak Flow      Pain Score 4     Pain Loc      Pain Education      Exclude from Growth Chart    No data found.  Updated Vital Signs BP 126/81 (BP Location: Right Arm)   Pulse 69   Temp 98.2 F (36.8 C) (Oral)   Resp 16   SpO2 98%   Visual Acuity Right Eye Distance:   Left Eye Distance:   Bilateral Distance:    Right Eye Near:   Left Eye Near:    Bilateral Near:     Physical Exam Vitals and nursing note reviewed.  Constitutional:      Appearance: She is not ill-appearing or toxic-appearing.  HENT:     Head: Normocephalic and atraumatic.     Right Ear: Hearing, tympanic membrane, ear canal and external ear normal.     Left Ear: Hearing, tympanic membrane, ear canal and  external ear normal.     Nose: Nose normal.     Mouth/Throat:     Lips: Pink.     Mouth: Mucous membranes are moist. No injury or oral lesions.     Dentition: Normal dentition.     Tongue: No lesions.     Pharynx: Uvula midline. Pharyngeal swelling and posterior oropharyngeal erythema present. No oropharyngeal exudate, uvula swelling or postnasal drip.     Tonsils: No tonsillar exudate. 1+ on the right. 1+ on the left.     Comments: Mild erythema to posterior oropharynx with small amount of clear postnasal drainage visualized. Eyes:     General: Lids are normal. Vision grossly intact. Gaze aligned appropriately.     Extraocular Movements: Extraocular movements intact.     Conjunctiva/sclera: Conjunctivae normal.  Neck:     Trachea: Trachea and phonation normal.  Pulmonary:     Effort: Pulmonary effort is normal.  Musculoskeletal:     Cervical back: Neck supple.  Lymphadenopathy:     Cervical: No cervical adenopathy.  Skin:    General: Skin is warm and dry.     Capillary Refill: Capillary refill takes less than 2 seconds.     Findings: No rash.  Neurological:     General: No focal deficit present.     Mental Status: She is alert and oriented to person, place, and time. Mental status is at baseline.     Cranial Nerves: No dysarthria or facial asymmetry.  Psychiatric:        Mood and Affect: Mood normal.        Speech: Speech normal.        Behavior: Behavior normal.        Thought Content: Thought content normal.        Judgment: Judgment normal.      UC Treatments / Results  Labs (all labs ordered are listed, but only abnormal results are displayed) Labs Reviewed  HIV ANTIBODY (ROUTINE TESTING W REFLEX)  RPR  POCT RAPID STREP A (OFFICE)  CERVICOVAGINAL ANCILLARY ONLY    EKG   Radiology No results found.  Procedures Procedures (including critical care time)  Medications Ordered in UC Medications - No data to display  Initial Impression / Assessment and Plan  / UC Course  I have reviewed the triage vital signs and the nursing notes.  Pertinent labs & imaging results that were available during my care of the patient were  reviewed by me and considered in my medical decision making (see chart for details).   1. Sore throat Evaluation suggests viral pharyngitis etiology.  Group A strep POC testing negative, throat culture pending, will treat based on throat culture results for bacterial pharyngitis if indicated. Low suspicion for mononucleosis, epiglottitis, peritonsillar abscess, etc. HEENT exam stable without red flag signs. Will manage this conservatively with supportive care as outlined in AVS.  Salt water gargles as needed, warm water with honey.    2. Acute vaginitis, screening for STD High clinical suspicion for acute yeast vaginitis, therefore will treat with diflucan  empirically.  Vaginal swab pending, will treat for other positive infections per protocol when labs result. Discussed tips to prevent recurrent yeast vaginitis infections. Safe sex precautions discussed.    Counseled patient on potential for adverse effects with medications prescribed/recommended today, strict ER and return-to-clinic precautions discussed, patient verbalized understanding.    Final Clinical Impressions(s) / UC Diagnoses   Final diagnoses:  Sore throat  Acute vaginitis  Screening for STD (sexually transmitted disease)     Discharge Instructions      I suspect that your  symptoms are due to suspected yeast infection.  Take medication as prescribed.  Your swab has been sent for testing, staff will call you in 2-3 days if you test positive for any other infections and will provide treatment at that time.  Strep test is negative. You may take Tylenol /ibuprofen as needed for sore throat. I have sent a throat culture for testing to make sure that the swab does not grow any bacteria underneath the microscope and we will call you with the results if  abnormal.     ED Prescriptions     Medication Sig Dispense Auth. Provider   fluconazole  (DIFLUCAN ) 150 MG tablet Take 1 tablet (150 mg total) by mouth every 3 (three) days. 2 tablet Enedelia Dorna HERO, FNP      PDMP not reviewed this encounter.   Enedelia Dorna HERO, [place] 11/13/23 [phone]

## 2023-11-13 NOTE — Discharge Instructions (Addendum)
 I suspect that your  symptoms are due to suspected yeast infection.  Take medication as prescribed.  Your swab has been sent for testing, staff will call you in 2-3 days if you test positive for any other infections and will provide treatment at that time.  Strep test is negative. You may take Tylenol /ibuprofen as needed for sore throat. I have sent a throat culture for testing to make sure that the swab does not grow any bacteria underneath the microscope and we will call you with the results if abnormal.

## 2023-11-14 ENCOUNTER — Ambulatory Visit (HOSPITAL_COMMUNITY): Payer: Self-pay

## 2023-11-14 LAB — HIV ANTIBODY (ROUTINE TESTING W REFLEX): HIV Screen 4th Generation wRfx: REACTIVE — AB

## 2023-11-14 LAB — CERVICOVAGINAL ANCILLARY ONLY
Bacterial Vaginitis (gardnerella): POSITIVE — AB
Candida Glabrata: NEGATIVE
Candida Vaginitis: POSITIVE — AB
Chlamydia: NEGATIVE
Comment: NEGATIVE
Comment: NEGATIVE
Comment: NEGATIVE
Comment: NEGATIVE
Comment: NEGATIVE
Comment: NORMAL
Neisseria Gonorrhea: NEGATIVE
Trichomonas: NEGATIVE

## 2023-11-14 LAB — RPR: RPR Ser Ql: NONREACTIVE

## 2023-11-15 ENCOUNTER — Telehealth (HOSPITAL_COMMUNITY): Payer: Self-pay | Admitting: *Deleted

## 2023-11-15 LAB — HIV-1/2 AB - DIFFERENTIATION
HIV 1 Ab: REACTIVE — AB
HIV 2 Ab: NONREACTIVE

## 2023-11-15 MED ORDER — METRONIDAZOLE 1 % EX GEL: Freq: Every day | CUTANEOUS | 0 refills | Status: DC

## 2023-11-15 MED ORDER — METRONIDAZOLE 500 MG PO TABS: 500.0000 mg | ORAL_TABLET | Freq: Two times a day (BID) | ORAL | 0 refills | Status: AC

## 2023-11-15 NOTE — Telephone Encounter (Signed)
 Spoke to [employer] she states that infectious disease will reach out to patient about her labs. Ive called pt back and advised her that they already have commented on her labs and will reach out to her but I have given her the number to their clinic as well.   I also advised her that provider here would send metrogel  to her pharmacy.

## 2023-11-15 NOTE — Telephone Encounter (Signed)
 Received vigilanz and epic alert for + hIV test  His discrminatory assay is now +  We would like to connect him to care  Do you want to reach out to him first and then we can so he wont be surprised wee are calling him. (He shouldn't be too surprised)

## 2023-11-15 NOTE — Telephone Encounter (Signed)
 pt called and wanted someone to go over HIV labs with her she went on [facility] site today to get results but it said she needs to talk to a provider her first.   She would also like metrogel  sent to pharmacy she states she go sick when she took the pills last time.

## 2023-11-16 ENCOUNTER — Telehealth: Payer: Self-pay

## 2023-11-16 ENCOUNTER — Encounter (HOSPITAL_COMMUNITY): Payer: Self-pay

## 2023-11-16 LAB — CULTURE, GROUP A STREP (THRC)

## 2023-11-16 MED ORDER — METRONIDAZOLE 0.75 % VA GEL: 1.0000 | Freq: Every day | VAGINAL | 0 refills | Status: AC

## 2023-11-16 NOTE — Telephone Encounter (Signed)
 Pt called and back agreed to appt on 7/14 due to work schedule.  No questions at this time. Lorenda CHRISTELLA Code, RMA

## 2023-11-16 NOTE — Addendum Note (Signed)
 Addended by: ARNALDO ALFONSO RAMAN on: 11/16/2023 11:04 AM   Modules accepted: Orders

## 2023-11-16 NOTE — Telephone Encounter (Signed)
 Called patient to schedule new patient appointment, no answer. Left HIPAA compliant voicemail requesting callback.   No new patient appointment slots available until 8/20. Alan Geralds, [facility] has agreed to see patient some time next week, would prefer a 60 minute slot to discuss new diagnosis.   Luccia Reinheimer, BSN, RN

## 2023-11-17 ENCOUNTER — Telehealth (HOSPITAL_COMMUNITY): Payer: Self-pay | Admitting: Internal Medicine

## 2023-11-17 NOTE — Telephone Encounter (Signed)
 Patient called using 2 patient identifiers to discuss recent HIV positive lab findings. She was able to see this result in her MyChart account and has been contacted by infectious disease to establish care for management. She does not have any questions for me at this time. Flagyl  gel is improving acute vaginitis.

## 2023-11-20 ENCOUNTER — Telehealth: Payer: Self-pay

## 2023-11-20 ENCOUNTER — Other Ambulatory Visit (HOSPITAL_COMMUNITY): Payer: Self-pay

## 2023-11-20 NOTE — Telephone Encounter (Signed)
 Pharmacy Patient Advocate Encounter  Insurance verification completed.   The patient is insured through [facility]   Ran test claim for Biktarvy . Currently a quantity of 30 is a 30 day supply and the co-pay is $0.00 . Cabenuva $0.00  This test claim was processed through [facility]- copay amounts may vary at other pharmacies due to pharmacy/plan contracts, or as the patient moves through the different stages of their insurance plan.

## 2023-11-29 ENCOUNTER — Other Ambulatory Visit: Payer: Self-pay

## 2023-11-29 ENCOUNTER — Ambulatory Visit (INDEPENDENT_AMBULATORY_CARE_PROVIDER_SITE_OTHER): Admitting: Pharmacist

## 2023-11-29 ENCOUNTER — Other Ambulatory Visit (HOSPITAL_COMMUNITY)
Admission: RE | Admit: 2023-11-29 | Discharge: 2023-11-29 | Disposition: A | Discharge status: Home / Self Care | Source: Ambulatory Visit | Attending: Infectious Disease | Admitting: Infectious Disease

## 2023-11-29 DIAGNOSIS — B20 Human immunodeficiency virus [HIV] disease: Secondary | ICD-10-CM

## 2023-11-29 LAB — POCT URINE PREGNANCY: Preg Test, Ur: NEGATIVE

## 2023-11-29 MED ORDER — BICTEGRAVIR-EMTRICITAB-TENOFOV 50-200-25 MG PO TABS: 1.0000 | ORAL_TABLET | Freq: Every day | ORAL | Status: AC

## 2023-11-29 MED ORDER — BIKTARVY 50-200-25 MG PO TABS: 1.0000 | ORAL_TABLET | Freq: Every day | ORAL | 3 refills | Status: DC

## 2023-11-29 NOTE — Addendum Note (Signed)
 Addended by: WADDELL ALAN PARAS on: 11/29/2023 03:05 PM   Modules accepted: Orders

## 2023-11-29 NOTE — Progress Notes (Signed)
   11/29/2023  HPI: Carrie Waters is a 23 y.o. female who presents to the [facility] clinic today to initiate care for a newly diagnosed HIV infection.  Patient Active Problem List   Diagnosis Date Noted   Meningitis 07/27/2021   Headache 07/27/2021   SIRS (systemic inflammatory response syndrome) (HCC) 07/27/2021    Patient's Medications  New Prescriptions   BICTEGRAVIR-EMTRICITABINE-TENOFOVIR AF (BIKTARVY ) 50-200-25 MG TABS TABLET    Take 1 tablet by mouth daily.  Previous Medications   ACETAMINOPHEN  (TYLENOL ) 500 MG TABLET    Take 1 tablet (500 mg total) by mouth every 6 (six) hours as needed.   FLUCONAZOLE  (DIFLUCAN ) 150 MG TABLET    Take 1 tablet (150 mg total) by mouth every 3 (three) days.   METRONIDAZOLE  (METROGEL ) 1 % GEL    Apply topically daily.   MULTIPLE VITAMIN (MULTIVITAMIN WITH MINERALS) TABS TABLET    Take 1 tablet by mouth daily.   VALACYCLOVIR  (VALTREX ) 1000 MG TABLET    Take 1 tablet (1,000 mg total) by mouth 3 (three) times daily.  Modified Medications   No medications on file  Discontinued Medications   No medications on file    Labs: No results found for: HIV1RNAQUANT, HIV1RNAVL, CD4TABS  RPR and STI Lab Results  Component Value Date   LABRPR NON REACTIVE 11/13/2023   LABRPR NON REACTIVE 05/28/2022   LABRPR NON REACTIVE 07/26/2021   LABRPR NON REACTIVE 01/19/2021    STI Results GC CT  11/13/2023  6:28 PM Negative  Negative   05/28/2022  5:59 PM Negative  Negative   07/24/2021  5:58 PM Negative  Negative   05/16/2021  6:30 PM Negative  Negative   01/19/2021 11:56 AM Negative  Negative   11/27/2020  4:35 PM Negative  Negative   07/05/2020  2:36 PM Negative  Negative   01/14/2020  8:35 PM Negative  Negative     Hepatitis B No results found for: HEPBSAB, HEPBSAG, HEPBCAB Hepatitis C No results found for: HEPCAB, HCVRNAPCRQN Hepatitis A No results found for: HAV Lipids: No results found for: CHOL, TRIG, HDL, CHOLHDL,  VLDL, LDLCALC  Current HIV Regimen: Treatment naive  Assessment: Carrie Waters is here today to initiate care with Biktarvy  for her newly diagnosed HIV infection. She is treatment naive. We will assess an initial HIV viral load and a CD4 count today. We will assess for genotype resistance today. Additionally, we will get baseline labs to assess her kidney function, lipid panel, HLA B*[id number], pregnancy status, and Hepatitis A and B immunity. Will test for STIs including RPR, gonorrhea, and chlamydia testing. She reports she completed therapy for her recent BV and Candida; and reports symptom resolution. Today, we will also check a TB test and Hepatitis B antigen to ensure the patient is not co-infected.   Will start patient on Biktarvy . We supplied the patient with a sample for 7 days. Her Biktarvy  is covered at no cost to her through her Medicaid, and she wishes to fill at the [facility] in [place], [place]. Counseled the patient on the importance of adherence, potential side effects, and U=U. She is eligible for the meningococcal booster, pneumococcal, and shingles vaccine, but we will discuss this at a later appointment.   Plan: -HIV RNA level -CD4 count -CMP -STI testing -CBC -Lipid panel -Quantiferon Gold -HepA antibody -HepB antigen and antibody -HcG test -Start Biktarvy  -Follow with Fleeta Rothman 01/02/24  Elma Fail, PharmD PGY1 Clinical Pharmacist [facility]  11/29/2023 9:46 AM

## 2023-11-29 NOTE — Progress Notes (Addendum)
 NEW REFERRAL TO [facility]  Medication Samples have been provided to the patient.  Drug name: Biktarvy        Strength: 50/200/25 mg       Qty: 7 tablets (1 bottles) LOT: [id number]   Exp.Date: 7/27  Samples requested by Alan Geralds, PharmD.  Dosing instructions: Take one tablet by mouth once daily  The patient has been instructed regarding the correct time, dose, and frequency of taking this medication, including desired effects and most common side effects.   Alan Geralds, PharmD, CPP, BCIDP, AAHIVP Clinical Pharmacist Practitioner Infectious Diseases Clinical Pharmacist [facility]

## 2023-11-30 ENCOUNTER — Telehealth: Payer: Self-pay | Admitting: Pharmacist

## 2023-11-30 DIAGNOSIS — R112 Nausea with vomiting, unspecified: Secondary | ICD-10-CM

## 2023-11-30 LAB — URINE CYTOLOGY ANCILLARY ONLY
Chlamydia: NEGATIVE
Comment: NEGATIVE
Comment: NORMAL
Neisseria Gonorrhea: NEGATIVE

## 2023-11-30 LAB — T-HELPER CELL (CD4) - (RCID CLINIC ONLY)
CD4 % Helper T Cell: 38 % (ref 33–65)
CD4 T Cell Abs: 426 /uL (ref 400–1790)

## 2023-11-30 MED ORDER — ONDANSETRON 4 MG PO TBDP: 4.0000 mg | ORAL_TABLET | Freq: Three times a day (TID) | ORAL | 3 refills | Status: AC | PRN

## 2023-11-30 NOTE — Telephone Encounter (Signed)
 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 Will prescribe Zofran  ODT 4mg  as needed and encouraged she take this at least 30 minutes prior to taking Biktarvy  to prevent nausea. Encouraged her to push through the first 2-4 weeks of therapy until her body adjusts to the new regimen. Requested she reach back out to us  if her nausea does not improve over the weekend.   Alan Geralds, PharmD, CPP, BCIDP, AAHIVP Clinical Pharmacist Practitioner Infectious Diseases Clinical Pharmacist [facility] Infectious Disease

## 2023-12-04 ENCOUNTER — Ambulatory Visit: Payer: Self-pay | Admitting: Pharmacist

## 2023-12-09 LAB — QUANTIFERON-TB GOLD PLUS
Mitogen-NIL: 6.82 [IU]/mL
NIL: 0.04 [IU]/mL
QuantiFERON-TB Gold Plus: NEGATIVE
TB1-NIL: 0 [IU]/mL
TB2-NIL: 0 [IU]/mL

## 2023-12-09 LAB — COMPLETE METABOLIC PANEL WITHOUT GFR
AG Ratio: 1.2 (calc) (ref 1.0–2.5)
ALT: 10 U/L (ref 6–29)
AST: 16 U/L (ref 10–30)
Albumin: 4.3 g/dL (ref 3.6–5.1)
Alkaline phosphatase (APISO): 47 U/L (ref 31–125)
BUN: 12 mg/dL (ref 7–25)
CO2: 26 mmol/L (ref 20–32)
Calcium: 9.6 mg/dL (ref 8.6–10.2)
Chloride: 105 mmol/L (ref 98–110)
Creat: 0.65 mg/dL (ref 0.50–0.96)
Globulin: 3.7 g/dL (ref 1.9–3.7)
Glucose, Bld: 79 mg/dL (ref 65–99)
Potassium: 3.8 mmol/L (ref 3.5–5.3)
Sodium: 138 mmol/L (ref 135–146)
Total Bilirubin: 0.3 mg/dL (ref 0.2–1.2)
Total Protein: 8 g/dL (ref 6.1–8.1)

## 2023-12-09 LAB — LIPID PANEL
Cholesterol: 136 mg/dL (ref <200)
HDL: 57 mg/dL (ref >=50)
LDL Cholesterol (Calc): 66 mg/dL
Non-HDL Cholesterol (Calc): 79 mg/dL (ref <130)
Total CHOL/HDL Ratio: 2.4 (calc) (ref <5.0)
Triglycerides: 58 mg/dL (ref <150)

## 2023-12-09 LAB — RPR: RPR Ser Ql: NONREACTIVE

## 2023-12-09 LAB — CBC WITH DIFFERENTIAL/PLATELET
Absolute Lymphocytes: 1246 {cells}/uL (ref 850–3900)
Absolute Monocytes: 414 {cells}/uL (ref 200–950)
Basophils Absolute: 8 {cells}/uL (ref 0–200)
Basophils Relative: 0.2 %
Eosinophils Absolute: 98 {cells}/uL (ref 15–500)
Eosinophils Relative: 2.4 %
HCT: 42.7 % (ref 35.0–45.0)
Hemoglobin: 13.5 g/dL (ref 11.7–15.5)
MCH: 27.7 pg (ref 27.0–33.0)
MCHC: 31.6 g/dL — ABNORMAL LOW (ref 32.0–36.0)
MCV: 87.7 fL (ref 80.0–100.0)
MPV: 11.6 fL (ref 7.5–12.5)
Monocytes Relative: 10.1 %
Neutro Abs: 2333 {cells}/uL (ref 1500–7800)
Neutrophils Relative %: 56.9 %
Platelets: 209 Thousand/uL (ref 140–400)
RBC: 4.87 Million/uL (ref 3.80–5.10)
RDW: 13.6 % (ref 11.0–15.0)
Total Lymphocyte: 30.4 %
WBC: 4.1 Thousand/uL (ref 3.8–10.8)

## 2023-12-09 LAB — HIV-1 GENOTYPE: HIV-1 Genotype: DETECTED — AB

## 2023-12-09 LAB — HLA B*5701: HLA-B*5701 w/rflx HLA-B High: NEGATIVE

## 2023-12-09 LAB — HEPATITIS A ANTIBODY, TOTAL: Hepatitis A AB,Total: NONREACTIVE

## 2023-12-09 LAB — HIV-1 RNA ULTRAQUANT REFLEX TO GENTYP+
HIV 1 RNA Quant: 35600 {copies}/mL — ABNORMAL HIGH
HIV-1 RNA Quant, Log: 4.55 {Log_copies}/mL — ABNORMAL HIGH

## 2023-12-09 LAB — HEPATITIS B CORE ANTIBODY, TOTAL: Hep B Core Total Ab: NONREACTIVE

## 2023-12-09 LAB — HEPATITIS B SURFACE ANTIBODY,QUALITATIVE: Hep B S Ab: REACTIVE — AB

## 2023-12-09 LAB — HEPATITIS B SURFACE ANTIGEN: Hepatitis B Surface Ag: NONREACTIVE

## 2023-12-31 NOTE — Progress Notes (Unsigned)
   Subjective:  Chief Complaint; newly diagnosed HIV infection    Patient ID: Carrie Waters, female    DOB: May 11, 2000, 23 y.o.   MRN: [id number]  HPI  No past medical history on file.  No past surgical history on file.  Family History  Problem Relation Age of Onset   Healthy Mother    Healthy Father       Social History   Socioeconomic History   Marital status: Single    Spouse name: Not on file   Number of children: Not on file   Years of education: Not on file   Highest education level: Not on file  Occupational History   Not on file  Tobacco Use   Smoking status: Never   Smokeless tobacco: Never  Vaping Use   Vaping status: Never Used  Substance and Sexual Activity   Alcohol use: Yes   Drug use: Never   Sexual activity: Yes    Birth control/protection: Implant  Other Topics Concern   Not on file  Social History Narrative   Not on file   Social Drivers of Health   Financial Resource Strain: Low Risk  (09/24/2022)   Received from [facility]   Overall Financial Resource Strain (CARDIA)    Difficulty of Paying Living Expenses: Not hard at all  Food Insecurity: No Food Insecurity (09/24/2022)   Received from [facility]   Hunger Vital Sign    Within the past 12 months, you worried that your food would run out before you got the money to buy more.: Never true    Within the past 12 months, the food you bought just didn't last and you didn't have money to get more.: Never true  Transportation Needs: No Transportation Needs (09/24/2022)   Received from [facility] - Transportation    In the past 12 months, has lack of transportation kept you from medical appointments or from getting medications?: No    Lack of Transportation (Non-Medical): No  Physical Activity: Not on file  Stress: Not on file  Social Connections: Not on file    Allergies  Allergen Reactions   Other Diarrhea    blueberries    Sulfa Antibiotics Hives and Rash     Current Outpatient Medications:    bictegravir-emtricitabine-tenofovir AF (BIKTARVY ) 50-200-25 MG TABS tablet, Take 1 tablet by mouth daily., Disp: 30 tablet, Rfl: 3   ondansetron  (ZOFRAN -ODT) 4 MG disintegrating tablet, Take 1 tablet (4 mg total) by mouth every 8 (eight) hours as needed for nausea or vomiting. Take 30 minutes before taking Biktarvy ., Disp: 20 tablet, Rfl: 3    Review of Systems     Objective:   Physical Exam        Assessment & Plan:

## 2024-01-02 ENCOUNTER — Encounter: Payer: Self-pay | Admitting: Infectious Disease

## 2024-01-02 ENCOUNTER — Ambulatory Visit (INDEPENDENT_AMBULATORY_CARE_PROVIDER_SITE_OTHER): Admitting: Infectious Disease

## 2024-01-02 ENCOUNTER — Other Ambulatory Visit: Payer: Self-pay

## 2024-01-02 VITALS — BP 119/81 | HR 81 | Temp 98.6°F | Wt 154.0 lb

## 2024-01-02 DIAGNOSIS — A6000 Herpesviral infection of urogenital system, unspecified: Secondary | ICD-10-CM | POA: Diagnosis not present

## 2024-01-02 DIAGNOSIS — Z113 Encounter for screening for infections with a predominantly sexual mode of transmission: Secondary | ICD-10-CM

## 2024-01-02 DIAGNOSIS — B20 Human immunodeficiency virus [HIV] disease: Secondary | ICD-10-CM

## 2024-01-02 DIAGNOSIS — Z124 Encounter for screening for malignant neoplasm of cervix: Secondary | ICD-10-CM

## 2024-01-02 DIAGNOSIS — Z23 Encounter for immunization: Secondary | ICD-10-CM | POA: Diagnosis not present

## 2024-01-02 DIAGNOSIS — B003 Herpesviral meningitis: Secondary | ICD-10-CM

## 2024-01-02 MED ORDER — BIKTARVY 50-200-25 MG PO TABS: 1.0000 | ORAL_TABLET | Freq: Every day | ORAL | 11 refills | Status: DC

## 2024-01-03 ENCOUNTER — Telehealth: Payer: Self-pay

## 2024-01-03 LAB — GC/CHLAMYDIA PROBE, AMP (THROAT)
Chlamydia trachomatis RNA: NOT DETECTED
Neisseria gonorrhoeae RNA: NOT DETECTED

## 2024-01-03 NOTE — Telephone Encounter (Signed)
 Called [place] to discuss new diagnosis and clinic introduction, she is currently at work. She requests a call next week 9/23 before noon.   Keyla Milone, BSN, RN

## 2024-01-04 LAB — T-HELPER CELLS (CD4) COUNT (NOT AT ARMC)
Absolute CD4: 625 {cells}/uL (ref 490–1740)
CD4 T Helper %: 38 % (ref 30–61)
Total lymphocyte count: 1647 {cells}/uL (ref 850–3900)

## 2024-01-04 LAB — HIV-1 RNA QUANT-NO REFLEX-BLD
HIV 1 RNA Quant: <20 {copies}/mL — AB
HIV-1 RNA Quant, Log: <1.3 {Log_copies}/mL — AB

## 2024-01-08 NOTE — Telephone Encounter (Signed)
 Attempted to call Jaselynn this morning, no answer.   Chinara Hertzberg, BSN, RN

## 2024-01-08 NOTE — Telephone Encounter (Signed)
 Spoke with Latoia, offered to answer any questions/address any concerns she has after her first couple of appointments with us .   She is working on finding a Scientist, physiological and wanted to know if she needed to disclose her new diagnosis to them. Discussed that it would be a good idea to provide that information so that they have her full medical history, but that she is not obligated to if she's not comfortable.   Reviewed U=U.  She does not have any other questions or concerns at the moment.   Karan Inclan, BSN, RN

## 2024-01-17 ENCOUNTER — Other Ambulatory Visit: Payer: Self-pay | Admitting: Pharmacist

## 2024-01-17 MED ORDER — BICTEGRAVIR-EMTRICITAB-TENOFOV 50-200-25 MG PO TABS: 1.0000 | ORAL_TABLET | Freq: Every day | ORAL | Status: AC

## 2024-01-17 NOTE — Progress Notes (Signed)
 Medication Samples have been provided to the patient.  Drug name: Biktarvy        Strength: 50/200/25 mg       Qty: 7 tablets (1 bottles) LOT: CTGMDA   Exp.Date: 7/27  Samples requested by Diminique Gainey.  Dosing instructions: Take one tablet by mouth once daily  The patient has been instructed regarding the correct time, dose, and frequency of taking this medication, including desired effects and most common side effects.   Nicklas Barns, PharmD, CPP, BCIDP, AAHIVP Clinical Pharmacist Practitioner Infectious Diseases Clinical Pharmacist [facility] for Infectious Disease

## 2024-02-01 ENCOUNTER — Ambulatory Visit
Admission: EM | Admit: 2024-02-01 | Discharge: 2024-02-01 | Disposition: A | Discharge status: Home / Self Care | Attending: Emergency Medicine | Admitting: Emergency Medicine

## 2024-02-01 DIAGNOSIS — J069 Acute upper respiratory infection, unspecified: Secondary | ICD-10-CM | POA: Diagnosis not present

## 2024-02-01 LAB — SARS CORONAVIRUS 2 BY RT PCR: SARS Coronavirus 2 by RT PCR: NEGATIVE

## 2024-02-01 LAB — GROUP A STREP BY PCR: Group A Strep by PCR: NOT DETECTED

## 2024-02-01 MED ORDER — BENZONATATE 100 MG PO CAPS: 200.0000 mg | ORAL_CAPSULE | Freq: Three times a day (TID) | ORAL | 0 refills | Status: DC

## 2024-02-01 MED ORDER — PROMETHAZINE-DM 6.25-15 MG/5ML PO SYRP: 5.0000 mL | ORAL_SOLUTION | Freq: Four times a day (QID) | ORAL | 0 refills | Status: DC | PRN

## 2024-02-01 MED ORDER — IPRATROPIUM BROMIDE 0.06 % NA SOLN: 2.0000 | Freq: Four times a day (QID) | NASAL | 12 refills | Status: DC

## 2024-02-01 NOTE — ED Triage Notes (Signed)
 Pt is with her mom  Pt c/o cough, headache, back pain x3days  Pt has taken OTC medication for symptoms - tylenol  and nyquil at 3am  Pt had a sore throat but states that that has gone away.

## 2024-02-01 NOTE — ED Provider Notes (Signed)
 [facility]    CSN: [id number] Arrival date & time: 02/01/24  1025      History   Chief Complaint Chief Complaint  Patient presents with   Cough   Headache    HPI Carrie Waters is a 23 y.o. female.   HPI  23 year old female with past medical history significant for SIRS, HSV-2, HIV, and situational mixed anxiety and depressive disorder presents for evaluation of 3 days worth of flulike symptoms to include headache, backache, and a cough.  She has been experiencing a sore throat but reports that has resolved.  She has been using Tylenol  and NyQuil for her symptoms.  She does report that several of her coworkers have been sick with respiratory symptoms.  She denies travel to state or country.  History reviewed. No pertinent past medical history.  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 No pertinent surgical history.  OB History   No obstetric history on file.      Home Medications    Prior to Admission medications   Medication Sig Start Date End Date Taking? Authorizing Provider  benzonatate  (TESSALON ) 100 MG capsule Take 2 capsules (200 mg total) by mouth every 8 (eight) hours. 02/01/24  Yes Bernardino Ditch, NP  bictegravir-emtricitabine-tenofovir AF (BIKTARVY ) 50-200-25 MG TABS tablet Take 1 tablet by mouth daily. 01/02/24  Yes Fleeta Rothman, Jomarie SAILOR, MD  etonogestrel (NEXPLANON) 68 MG IMPL implant 1 each by Subdermal route once.   Yes [provider]  ipratropium (ATROVENT) 0.06 % nasal spray Place 2 sprays into both nostrils 4 (four) times daily. 02/01/24  Yes Bernardino Ditch, NP  ondansetron  (ZOFRAN -ODT) 4 MG disintegrating tablet Take 1 tablet (4 mg total) by  mouth every 8 (eight) hours as needed for nausea or vomiting. Take 30 minutes before taking Biktarvy . 11/30/23  Yes Waddell Alan PARAS, RPH-CPP  promethazine-dextromethorphan (PROMETHAZINE-DM) 6.25-15 MG/5ML syrup Take 5 mLs by mouth 4 (four) times daily as needed. 02/01/24  Yes Bernardino Ditch, NP    Family History Family History  Problem Relation Age of Onset   Healthy Mother    Healthy Father     Social History Social History   Tobacco Use   Smoking status: Never   Smokeless tobacco: Never  Vaping Use   Vaping status: Never Used  Substance Use Topics   Alcohol use: Not Currently   Drug use: Never     Allergies   Other and Sulfa antibiotics   Review of Systems Review of Systems  Constitutional:  Negative for fever.  HENT:  Positive for congestion, rhinorrhea and sore throat. Negative for ear pain.   Respiratory:  Positive for cough. Negative for shortness of breath and wheezing.   Musculoskeletal:  Positive for back pain.  Neurological:  Positive for headaches.     Physical Exam Triage Vital Signs ED Triage Vitals  Encounter Vitals Group     BP 02/01/24 1104 120/79     Girls Systolic BP Percentile --      Girls Diastolic BP Percentile --      Boys Systolic BP Percentile --      Boys Diastolic BP Percentile --      Pulse Rate 02/01/24 1104 81     Resp  02/01/24 1104 16     Temp 02/01/24 1104 98.5 F (36.9 C)     Temp Source 02/01/24 1104 Oral     SpO2 02/01/24 1104 100 %     Weight 02/01/24 1104 153 lb 3.2 oz (69.5 kg)     Height --      Head Circumference --      Peak Flow --      Pain Score 02/01/24 1103 5     Pain Loc --      Pain Education --      Exclude from Growth Chart --    No data found.  Updated Vital Signs BP 120/79 (BP Location: Left Arm)   Pulse 81   Temp 98.5 F (36.9 C) (Oral)   Resp 16   Wt 153 lb 3.2 oz (69.5 kg)   LMP 12/02/2023   SpO2 100%   BMI 30.94 kg/m   Visual Acuity Right Eye Distance:   Left Eye Distance:   Bilateral  Distance:    Right Eye Near:   Left Eye Near:    Bilateral Near:     Physical Exam Vitals and nursing note reviewed.  Constitutional:      Appearance: Normal appearance. She is not ill-appearing.  HENT:     Head: Normocephalic and atraumatic.     Right Ear: Tympanic membrane, ear canal and external ear normal. There is no impacted cerumen.     Left Ear: Tympanic membrane, ear canal and external ear normal. There is no impacted cerumen.     Nose: Congestion and rhinorrhea present.     Comments: Nasal mucosa is erythematous and edematous with clear discharge in both naris.    Mouth/Throat:     Mouth: Mucous membranes are moist.     Pharynx: Oropharyngeal exudate and posterior oropharyngeal erythema present.     Comments: Tonsillar pillars are 2+ edematous, erythematous, with white exudate. Cardiovascular:     Rate and Rhythm: Normal rate and regular rhythm.     Pulses: Normal pulses.     Heart sounds: Normal heart sounds. No murmur heard.    No friction rub. No gallop.  Pulmonary:     Effort: Pulmonary effort is normal.     Breath sounds: Normal breath sounds. No wheezing, rhonchi or rales.  Musculoskeletal:     Cervical back: Normal range of motion and neck supple. No tenderness.  Lymphadenopathy:     Cervical: No cervical adenopathy.  Skin:    General: Skin is warm and dry.     Capillary Refill: Capillary refill takes less than 2 seconds.     Findings: No rash.  Neurological:     General: No focal deficit present.     Mental Status: She is alert and oriented to person, place, and time.      UC Treatments / Results  Labs (all labs ordered are listed, but only abnormal results are displayed) Labs Reviewed  SARS CORONAVIRUS 2 BY RT PCR  GROUP A STREP BY PCR    EKG   Radiology No results found.  Procedures Procedures (including critical care time)  Medications Ordered in UC Medications - No data to display  Initial Impression / Assessment and Plan / UC  Course  I have reviewed the triage vital signs and the nursing notes.  Pertinent labs & imaging results that were available during my care of the patient were reviewed by me and considered in my medical decision making (see chart for details).   Patient is a  pleasant, nontoxic-appearing 23 year old female presenting for evaluation of 3 days with a respiratory symptoms as outlined in HPI above.  Her physical exam does reveal inflammation of her upper respiratory tract as evidenced by inflamed nasal mucosa with clear nasal discharge.  She also is edematous and erythematous tonsillar pillars with white exudate.  No cervical lymphadenopathy present.  Cardiopulmonary exam reveals good lung sounds in all fields.  Differential diagnose include COVID, influenza, strep pharyngitis, viral illness.  Due to the fact that she is on day 3 of symptoms I would not test her for influenza at this time but I will order a COVID PCR and strep PCR.  Strep PCR is negative.  COVID PCR is negative.  I will discharge patient home with diagnosis of viral URI with a cough.  I will prescribe Atrovent nasal spray for nasal congestion.  Tessalon  Perles and Promethazine DM cough syrup for cough and congestion.  Return precautions reviewed.  Work note provided.   Final Clinical Impressions(s) / UC Diagnoses   Final diagnoses:  Viral URI with cough     Discharge Instructions      Your testing today was negative for COVID and strep.  However, I do believe you have a respiratory virus which is causing your symptoms.  Use over-the-counter Tylenol  and or ibuprofen according to the package instructions as needed for any fever or pain.  Use the Atrovent nasal spray, 2 squirts in each nostril every 6 hours, as needed for runny nose and postnasal drip.  Use the Tessalon  Perles every 8 hours during the day.  Take them with a small sip of water.  They may give you some numbness to the base of your tongue or a metallic taste in  your mouth, this is normal.  Use the Promethazine DM cough syrup at bedtime for cough and congestion.  It will make you drowsy so do not take it during the day.  Return for reevaluation or see your primary care provider for any new or worsening symptoms.      ED Prescriptions     Medication Sig Dispense Auth. Provider   benzonatate  (TESSALON ) 100 MG capsule Take 2 capsules (200 mg total) by mouth every 8 (eight) hours. 21 capsule Bernardino Ditch, NP   ipratropium (ATROVENT) 0.06 % nasal spray Place 2 sprays into both nostrils 4 (four) times daily. 15 mL Bernardino Ditch, NP   promethazine-dextromethorphan (PROMETHAZINE-DM) 6.25-15 MG/5ML syrup Take 5 mLs by mouth 4 (four) times daily as needed. 118 mL Bernardino Ditch, NP      PDMP not reviewed this encounter.   Bernardino Ditch, NP 02/01/24 1149

## 2024-02-01 NOTE — Discharge Instructions (Addendum)
 Your testing today was negative for COVID and strep.  However, I do believe you have a respiratory virus which is causing your symptoms.  Use over-the-counter Tylenol  and or ibuprofen according to the package instructions as needed for any fever or pain.  Use the Atrovent nasal spray, 2 squirts in each nostril every 6 hours, as needed for runny nose and postnasal drip.  Use the Tessalon  Perles every 8 hours during the day.  Take them with a small sip of water.  They may give you some numbness to the base of your tongue or a metallic taste in your mouth, this is normal.  Use the Promethazine DM cough syrup at bedtime for cough and congestion.  It will make you drowsy so do not take it during the day.  Return for reevaluation or see your primary care provider for any new or worsening symptoms.

## 2024-02-11 ENCOUNTER — Telehealth: Payer: Self-pay | Admitting: Emergency Medicine

## 2024-03-12 ENCOUNTER — Ambulatory Visit: Payer: Self-pay | Admitting: Infectious Disease

## 2024-03-13 ENCOUNTER — Other Ambulatory Visit: Payer: Self-pay | Admitting: Pharmacist

## 2024-03-13 DIAGNOSIS — B20 Human immunodeficiency virus [HIV] disease: Secondary | ICD-10-CM

## 2024-03-17 ENCOUNTER — Encounter: Payer: Self-pay | Admitting: Infectious Disease

## 2024-03-17 DIAGNOSIS — B20 Human immunodeficiency virus [HIV] disease: Secondary | ICD-10-CM | POA: Insufficient documentation

## 2024-03-17 NOTE — Progress Notes (Unsigned)
  Chief complaint: follow-up for HIV disease on medications  Subjective:    Patient ID: Carrie Waters, female    DOB: [birth year], 23 y.o.   MRN: [id number]  HPI  No past medical history on file.  No past surgical history on file.  Family History  Problem Relation Age of Onset   Healthy Mother    Healthy Father       Social History   Socioeconomic History   Marital status: Single    Spouse name: Not on file   Number of children: Not on file   Years of education: Not on file   Highest education level: Not on file  Occupational History   Not on file  Tobacco Use   Smoking status: Never   Smokeless tobacco: Never  Vaping Use   Vaping status: Never Used  Substance and Sexual Activity   Alcohol use: Not Currently   Drug use: Never   Sexual activity: Yes    Birth control/protection: Implant  Other Topics Concern   Not on file  Social History Narrative   Not on file   Social Drivers of Health   Financial Resource Strain: Low Risk  (09/24/2022)   Received from [facility]   Overall Financial Resource Strain (CARDIA)    Difficulty of Paying Living Expenses: Not hard at all  Food Insecurity: No Food Insecurity (09/24/2022)   [facility]   Hunger Vital Sign    Within the past 12 months, you worried that your food would run out before you got the money to buy more.: Never true    Within the past 12 months, the food you bought just didn't last and you didn't have money to get more.: Never true  Transportation Needs: No Transportation Needs (09/24/2022)   Received from [facility] - Transportation    In the past 12 months, has lack of transportation kept you from medical appointments or from getting medications?: No    Lack of Transportation (Non-Medical): No  Physical Activity: Not on file  Stress: Not on file  Social Connections: Not on file    Allergies  Allergen Reactions   Other Diarrhea     blueberries   Sulfa Antibiotics Hives and Rash     Current Outpatient Medications:    benzonatate  (TESSALON ) 100 MG capsule, Take 2 capsules (200 mg total) by mouth every 8 (eight) hours., Disp: 21 capsule, Rfl: 0   bictegravir-emtricitabine-tenofovir AF (BIKTARVY ) 50-200-25 MG TABS tablet, Take 1 tablet by mouth daily., Disp: 30 tablet, Rfl: 11   etonogestrel (NEXPLANON) 68 MG IMPL implant, 1 each by Subdermal route once., Disp: , Rfl:    ipratropium (ATROVENT) 0.06 % nasal spray, Place 2 sprays into both nostrils 4 (four) times daily., Disp: 15 mL, Rfl: 12   ondansetron  (ZOFRAN -ODT) 4 MG disintegrating tablet, Take 1 tablet (4 mg total) by mouth every 8 (eight) hours as needed for nausea or vomiting. Take 30 minutes before taking Biktarvy ., Disp: 20 tablet, Rfl: 3   promethazine-dextromethorphan (PROMETHAZINE-DM) 6.25-15 MG/5ML syrup, Take 5 mLs by mouth 4 (four) times daily as needed., Disp: 118 mL, Rfl: 0    Review of Systems     Objective:   Physical Exam        Assessment & Plan:

## 2024-03-17 NOTE — Telephone Encounter (Signed)
Appt 1/22

## 2024-03-18 ENCOUNTER — Ambulatory Visit: Admitting: Infectious Disease

## 2024-03-18 ENCOUNTER — Encounter: Payer: Self-pay | Admitting: Infectious Disease

## 2024-03-18 ENCOUNTER — Other Ambulatory Visit: Payer: Self-pay

## 2024-03-18 ENCOUNTER — Other Ambulatory Visit (HOSPITAL_COMMUNITY)
Admission: RE | Admit: 2024-03-18 | Discharge: 2024-03-18 | Disposition: A | Source: Ambulatory Visit | Attending: Infectious Disease | Admitting: Infectious Disease

## 2024-03-18 VITALS — BP 98/70 | HR 73 | Temp 97.3°F | Ht 59.0 in | Wt 155.0 lb

## 2024-03-18 DIAGNOSIS — Z79899 Other long term (current) drug therapy: Secondary | ICD-10-CM | POA: Diagnosis not present

## 2024-03-18 DIAGNOSIS — B20 Human immunodeficiency virus [HIV] disease: Secondary | ICD-10-CM

## 2024-03-18 DIAGNOSIS — F4323 Adjustment disorder with mixed anxiety and depressed mood: Secondary | ICD-10-CM

## 2024-03-18 DIAGNOSIS — R052 Subacute cough: Secondary | ICD-10-CM

## 2024-03-18 DIAGNOSIS — B009 Herpesviral infection, unspecified: Secondary | ICD-10-CM | POA: Insufficient documentation

## 2024-03-18 DIAGNOSIS — B003 Herpesviral meningitis: Secondary | ICD-10-CM

## 2024-03-18 DIAGNOSIS — R051 Acute cough: Secondary | ICD-10-CM

## 2024-03-18 DIAGNOSIS — Z124 Encounter for screening for malignant neoplasm of cervix: Secondary | ICD-10-CM

## 2024-03-18 DIAGNOSIS — G039 Meningitis, unspecified: Secondary | ICD-10-CM

## 2024-03-18 MED ORDER — VALACYCLOVIR HCL 1 G PO TABS: 1000.0000 mg | ORAL_TABLET | Freq: Every day | ORAL | 11 refills | Status: AC

## 2024-03-18 MED ORDER — VALACYCLOVIR HCL 1 G PO TABS: 1000.0000 mg | ORAL_TABLET | Freq: Three times a day (TID) | ORAL | 4 refills | Status: AC

## 2024-03-18 MED ORDER — VALACYCLOVIR HCL 1 G PO TABS: 1000.0000 mg | ORAL_TABLET | Freq: Two times a day (BID) | ORAL | 6 refills | Status: DC

## 2024-03-18 MED ORDER — BIKTARVY 50-200-25 MG PO TABS: 1.0000 | ORAL_TABLET | Freq: Every day | ORAL | 11 refills | Status: AC

## 2024-03-18 NOTE — Patient Instructions (Signed)
 Appt with [facility] for pelvic exam and pap smear

## 2024-03-19 LAB — URINE CYTOLOGY ANCILLARY ONLY
Chlamydia: NEGATIVE
Comment: NEGATIVE
Comment: NORMAL
Neisseria Gonorrhea: NEGATIVE

## 2024-03-19 LAB — T-HELPER CELLS (CD4) COUNT (NOT AT ARMC)
CD4 % Helper T Cell: 45 % (ref 33–65)
CD4 T Cell Abs: 701 /uL (ref 400–1790)

## 2024-03-20 LAB — SYPHILIS: RPR W/REFLEX TO RPR TITER AND TREPONEMAL ANTIBODIES, TRADITIONAL SCREENING AND DIAGNOSIS ALGORITHM: RPR Ser Ql: NONREACTIVE

## 2024-03-20 LAB — HIV-1 RNA QUANT-NO REFLEX-BLD
HIV 1 RNA Quant: 22 {copies}/mL — ABNORMAL HIGH
HIV-1 RNA Quant, Log: 1.34 {Log_copies}/mL — ABNORMAL HIGH

## 2024-04-28 ENCOUNTER — Other Ambulatory Visit: Payer: Self-pay

## 2024-04-28 ENCOUNTER — Ambulatory Visit: Admitting: Infectious Diseases

## 2024-04-28 ENCOUNTER — Other Ambulatory Visit (HOSPITAL_COMMUNITY)
Admission: RE | Admit: 2024-04-28 | Discharge: 2024-04-28 | Disposition: A | Discharge status: Home / Self Care | Source: Ambulatory Visit | Attending: Infectious Diseases | Admitting: Infectious Diseases

## 2024-04-28 ENCOUNTER — Encounter: Payer: Self-pay | Admitting: Infectious Diseases

## 2024-04-28 VITALS — BP 108/72 | HR 79 | Temp 98.3°F | Wt 153.2 lb

## 2024-04-28 DIAGNOSIS — Z124 Encounter for screening for malignant neoplasm of cervix: Secondary | ICD-10-CM | POA: Insufficient documentation

## 2024-04-28 NOTE — Assessment & Plan Note (Signed)
 Normal pelvic exam.  Cervical brushing collected for cytology w/ reflex HPV testing if >/= ASCUS.  Cervical STI screening obtained - no symptoms or concerns.  Discussed recommended screening interval for women living with HIV disease meant to be lifelong and at an interval of Q1-3 years pending results. Acceptable to space out Q3y with 3 consecutively normal exams. Further recommendations for Carrie Waters to follow today's results.

## 2024-04-28 NOTE — Progress Notes (Signed)
"  ° °  ° °  SUBJECTIVE    Carrie Waters is a 24 y.o. female here for an pelvic exam and pap smear.   Review of Systems: Current GYN complaints or concerns: none  States she had a pap smear at her last health clinic - does not report any abnormalities that warranted any intervention outside of routine monitoring. On Nexplanon for contraception management with occasional breakthrough bleeding. No sexual health concerns otherwise  .  Patient denies any abdominal/pelvic pain, problems with bowel movements, urination, vaginal discharge or intercourse.   Past Medical History:  Diagnosis Date   HIV disease (HCC) 03/17/2024    Gynecologic History: No obstetric history on file.  No LMP recorded. Patient has had an implant. Contraception: Nexplanon Last Pap: unknown exact date. Results were: normal Last Mammogram: n/a. Results were:     Objective  Physical Exam - chaperone present  Constitutional: Well developed, well nourished, no acute distress. She is alert and oriented x3.  Pelvic: External genitalia is normal in appearance. The vagina is normal in appearance. The cervix is bulbous and easily visualized. No CMT, normal expected cervical mucus present. normal mood and affect.        Assessment & Plan:    Problem List Items Addressed This Visit       Unprioritized   Screening for cervical cancer - Primary   Normal pelvic exam.  Cervical brushing collected for cytology w/ reflex HPV testing if >/= ASCUS.  Cervical STI screening obtained - no symptoms or concerns.  Discussed recommended screening interval for women living with HIV disease meant to be lifelong and at an interval of Q1-3 years pending results. Acceptable to space out Q3y with 3 consecutively normal exams. Further recommendations for Carrie Waters to follow today's results.       Relevant Orders   Cytology - PAP( [facility])   Cervicovaginal ancillary only( [place])    FU with Dr. Fleeta Rothman as scheduled for  routine care   Carrie Fireman, MSN, NP-C [facility] for Infectious Disease [facility]  [email or web address] Pager: [phone] Office: [phone] [facility] Main Line: [phone] *Secure Chat Communication Welcome   "

## 2024-04-29 ENCOUNTER — Ambulatory Visit: Payer: Self-pay | Admitting: Infectious Diseases

## 2024-04-29 LAB — CERVICOVAGINAL ANCILLARY ONLY
Chlamydia: NEGATIVE
Comment: NEGATIVE
Comment: NEGATIVE
Comment: NORMAL
Neisseria Gonorrhea: NEGATIVE
Trichomonas: NEGATIVE

## 2024-04-30 LAB — CYTOLOGY - PAP
Comment: NEGATIVE
Diagnosis: NEGATIVE
High risk HPV: NEGATIVE

## 2024-05-10 ENCOUNTER — Telehealth

## 2024-05-10 DIAGNOSIS — B3731 Acute candidiasis of vulva and vagina: Secondary | ICD-10-CM | POA: Diagnosis not present

## 2024-05-10 MED ORDER — FLUCONAZOLE 150 MG PO TABS: 150.0000 mg | ORAL_TABLET | ORAL | 0 refills | Status: AC

## 2024-05-10 MED ORDER — FLUCONAZOLE 150 MG PO TABS: 150.0000 mg | ORAL_TABLET | Freq: Once | ORAL | 0 refills | Status: DC

## 2024-05-10 NOTE — Patient Instructions (Signed)
" °  Kamden Jerrye, thank you for joining Haze LELON Servant, NP for today's virtual visit.  While this provider is not your primary care provider (PCP), if your PCP is located in our provider database this encounter information will be shared with them immediately following your visit.   A [facility] MyChart account gives you access to today's visit and all your visits, tests, and labs performed at [facility]  click here if you don't have a [facility] MyChart account or go to [email or web address]  Consent: (Patient) Carrie Waters provided verbal consent for this virtual visit at the beginning of the encounter.  Current Medications:  Current Outpatient Medications:    bictegravir-emtricitabine-tenofovir AF (BIKTARVY ) 50-200-25 MG TABS tablet, Take 1 tablet by mouth daily., Disp: 30 tablet, Rfl: 11   etonogestrel (NEXPLANON) 68 MG IMPL implant, 1 each by Subdermal route once., Disp: , Rfl:    fluconazole  (DIFLUCAN ) 150 MG tablet, Take 1 tablet (150 mg total) by mouth every 3 (three) days., Disp: 2 tablet, Rfl: 0   ondansetron  (ZOFRAN -ODT) 4 MG disintegrating tablet, Take 1 tablet (4 mg total) by mouth every 8 (eight) hours as needed for nausea or vomiting. Take 30 minutes before taking Biktarvy ., Disp: 20 tablet, Rfl: 3   valACYclovir  (VALTREX ) 1000 MG tablet, Take 1 tablet (1,000 mg total) by mouth daily., Disp: 30 tablet, Rfl: 11   valACYclovir  (VALTREX ) 1000 MG tablet, Take 1 tablet (1,000 mg total) by mouth 3 (three) times daily. This higher dose rx is for when you have headaches and symptoms consistent with meningitis (Patient not taking: Reported on 04/28/2024), Disp: 30 tablet, Rfl: 4   Medications ordered in this encounter:  Meds ordered this encounter  Medications   DISCONTD: fluconazole  (DIFLUCAN ) 150 MG tablet    Sig: Take 1 tablet (150 mg total) by mouth once for 1 dose.    Dispense:  1 tablet    Refill:  0    Supervising Provider:   LAMPTEY, PHILIP O [[id number]]    fluconazole  (DIFLUCAN ) 150 MG tablet    Sig: Take 1 tablet (150 mg total) by mouth every 3 (three) days.    Dispense:  2 tablet    Refill:  0    Supervising Provider:   LAMPTEY, PHILIP O [[id number]]     *If you need refills on other medications prior to your next appointment, please contact your pharmacy*  Follow-Up: Call back or seek an in-person evaluation if the symptoms worsen or if the condition fails to improve as anticipated.  [facility] [phone]  Other Instructions    If you have been instructed to have an in-person evaluation today at a local Urgent Care facility, please use the link below. It will take you to a list of all of our available [facility], including address, phone number and hours of operation. Please do not delay care.  [facility]  If you or a family member do not have a primary care provider, use the link below to schedule a visit and establish care. When you choose a [facility] primary care physician or advanced practice provider, you gain a long-term partner in health. Find a Primary Care Provider  Learn more about [facility] in-office and virtual care options: [facility] - Get Care Now  "

## 2024-05-10 NOTE — Progress Notes (Signed)
 " Virtual Visit Consent   Carrie Waters, you are scheduled for a virtual visit with a [facility] provider today. Just as with appointments in the office, your consent must be obtained to participate. Your consent will be active for this visit and any virtual visit you may have with one of our providers in the next 365 days. If you have a MyChart account, a copy of this consent can be sent to you electronically.  As this is a virtual visit, video technology does not allow for your provider to perform a traditional examination. This may limit your provider's ability to fully assess your condition. If your provider identifies any concerns that need to be evaluated in person or the need to arrange testing (such as labs, EKG, etc.), we will make arrangements to do so. Although advances in technology are sophisticated, we cannot ensure that it will always work on either your end or our end. If the connection with a video visit is poor, the visit may have to be switched to a telephone visit. With either a video or telephone visit, we are not always able to ensure that we have a secure connection.  By engaging in this virtual visit, you consent to the provision of healthcare and authorize for your insurance to be billed (if applicable) for the services provided during this visit. Depending on your insurance coverage, you may receive a charge related to this service.  I need to obtain your verbal consent now. Are you willing to proceed with your visit today? Carrie Waters has provided verbal consent on 05/10/2024 for a virtual visit (video or telephone). Carrie LELON Servant, NP  Date: 05/10/2024 9:48 AM   Virtual Visit via Video Note   I, Carrie Waters, connected with  Carrie Waters  ([id number], 06/25/2000) on 05/10/24 at  8:45 AM EST by a video-enabled telemedicine application and verified that I am speaking with the correct person using two identifiers.  Location: Patient: Virtual Visit Location Patient:  Home Provider: Virtual Visit Location Provider: Home Office   I discussed the limitations of evaluation and management by telemedicine and the availability of in person appointments. The patient expressed understanding and agreed to proceed.    History of Present Illness: Carrie Waters is a 24 y.o. who identifies as a female who was assigned female at birth, and is being seen today for vaginitis.  Over the past few days Carrie Waters has been experiencing vaginal itching, irritation and increased vaginal discharge. No fever or any additional GU symptoms.    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  Resting comfortably at home.  Head is normocephalic, atraumatic.  No labored breathing.  Speech is clear and coherent with logical content.  Patient is alert and oriented at baseline.    Assessment and Plan: 1. Yeast vaginitis (Primary) - fluconazole  (DIFLUCAN ) 150 MG tablet; Take 1 tablet (150 mg total) by mouth every 3 (three) days.  Dispense: 2 tablet; Refill: 0   Follow Up Instructions: I discussed the assessment and treatment plan with the patient. The patient was  provided an opportunity to ask questions and all were answered. The patient agreed with the plan and demonstrated an understanding of the instructions.  A copy of instructions were sent to the patient via MyChart unless otherwise noted below.    The patient was advised to call back or seek an in-person evaluation if the  symptoms worsen or if the condition fails to improve as anticipated.    Carrie LELON Servant, NP     [1]  Allergies Allergen Reactions   Other Diarrhea    blueberries   Sulfa Antibiotics Hives and Rash  [2]  Current Outpatient Medications:    bictegravir-emtricitabine-tenofovir AF (BIKTARVY ) 50-200-25 MG TABS tablet, Take 1 tablet by mouth daily., Disp: 30 tablet, Rfl: 11   etonogestrel (NEXPLANON) 68 MG IMPL implant, 1 each by Subdermal route once., Disp: , Rfl:    fluconazole  (DIFLUCAN ) 150 MG tablet, Take 1 tablet (150 mg total) by mouth every 3 (three) days., Disp: 2 tablet, Rfl: 0   ondansetron  (ZOFRAN -ODT) 4 MG disintegrating tablet, Take 1 tablet (4 mg total) by mouth every 8 (eight) hours as needed for nausea or vomiting. Take 30 minutes before taking Biktarvy ., Disp: 20 tablet, Rfl: 3   valACYclovir  (VALTREX ) 1000 MG tablet, Take 1 tablet (1,000 mg total) by mouth daily., Disp: 30 tablet, Rfl: 11   valACYclovir  (VALTREX ) 1000 MG tablet, Take 1 tablet (1,000 mg total) by mouth 3 (three) times daily. This higher dose rx is for when you have headaches and symptoms consistent with meningitis (Patient not taking: Reported on 04/28/2024), Disp: 30 tablet, Rfl: 4  "

## 2024-07-21 ENCOUNTER — Ambulatory Visit: Payer: Self-pay | Admitting: Infectious Disease
